# Patient Record
Sex: Male | Born: 1990 | Race: Black or African American | Hispanic: No | Marital: Married | Smoking: Former smoker
Health system: Southern US, Community
[De-identification: ages and names within clinical notes are randomized; demographics above are authoritative.]

## PROBLEM LIST (undated history)

## (undated) ENCOUNTER — Ambulatory Visit: Admission: EM | Payer: Self-pay | Source: Home / Self Care

## (undated) DIAGNOSIS — F17200 Nicotine dependence, unspecified, uncomplicated: Secondary | ICD-10-CM

## (undated) DIAGNOSIS — J45909 Unspecified asthma, uncomplicated: Secondary | ICD-10-CM

## (undated) HISTORY — DX: Nicotine dependence, unspecified, uncomplicated: F17.200

## (undated) HISTORY — PX: OTHER SURGICAL HISTORY: SHX169

---

## 2019-05-17 ENCOUNTER — Ambulatory Visit: Payer: Self-pay | Attending: Internal Medicine

## 2019-05-17 DIAGNOSIS — Z23 Encounter for immunization: Secondary | ICD-10-CM

## 2019-05-17 NOTE — Progress Notes (Signed)
   Covid-19 Vaccination Clinic  Name:  Randy Molina    MRN: [id number] DOB: 1990/12/13  05/17/2019  Mr. Randy Molina was observed post Covid-19 immunization for 15 minutes without incident. He was provided with Vaccine Information Sheet and instruction to access the V-Safe system.   Mr. Randy Molina was instructed to call 911 with any severe reactions post vaccine: Marland Kitchen Difficulty breathing  . Swelling of face and throat  . A fast heartbeat  . A bad rash all over body  . Dizziness and weakness   Immunizations Administered    Name Date Dose VIS Date Route   Pfizer COVID-19 Vaccine 05/17/2019  9:38 AM 0.3 mL 02/26/2018 Intramuscular   Manufacturer: [employer], [employer]   Lot: [id number]   NDC: [address]-4

## 2019-06-09 ENCOUNTER — Ambulatory Visit: Payer: Self-pay | Attending: Internal Medicine

## 2019-06-09 DIAGNOSIS — Z23 Encounter for immunization: Secondary | ICD-10-CM

## 2019-06-09 NOTE — Progress Notes (Signed)
   Covid-19 Vaccination Clinic  Name:  Demichael Traum    MRN: [id number] DOB: [birth year]  06/09/2019  Mr. Reicher was observed post Covid-19 immunization for 15 minutes without incident. He was provided with Vaccine Information Sheet and instruction to access the V-Safe system.   Mr. Mcmanaway was instructed to call 911 with any severe reactions post vaccine: Marland Kitchen Difficulty breathing  . Swelling of face and throat  . A fast heartbeat  . A bad rash all over body  . Dizziness and weakness   Immunizations Administered    Name Date Dose VIS Date Route   Pfizer COVID-19 Vaccine 06/09/2019 11:47 AM 0.3 mL 02/26/2018 Intramuscular   Manufacturer: [employer], [employer]   Lot: [id number]   NDC: [address]-4

## 2021-01-17 ENCOUNTER — Encounter (HOSPITAL_BASED_OUTPATIENT_CLINIC_OR_DEPARTMENT_OTHER): Payer: Self-pay

## 2021-01-17 ENCOUNTER — Emergency Department (HOSPITAL_BASED_OUTPATIENT_CLINIC_OR_DEPARTMENT_OTHER)
Admission: EM | Admit: 2021-01-17 | Discharge: 2021-01-17 | Disposition: A | Discharge status: Home / Self Care | Payer: Medicare Other | Attending: Emergency Medicine | Admitting: Emergency Medicine

## 2021-01-17 ENCOUNTER — Other Ambulatory Visit: Payer: Self-pay

## 2021-01-17 ENCOUNTER — Emergency Department (HOSPITAL_BASED_OUTPATIENT_CLINIC_OR_DEPARTMENT_OTHER): Payer: Medicare Other

## 2021-01-17 DIAGNOSIS — F17210 Nicotine dependence, cigarettes, uncomplicated: Secondary | ICD-10-CM | POA: Diagnosis not present

## 2021-01-17 DIAGNOSIS — R9431 Abnormal electrocardiogram [ECG] [EKG]: Secondary | ICD-10-CM

## 2021-01-17 DIAGNOSIS — Z20822 Contact with and (suspected) exposure to covid-19: Secondary | ICD-10-CM | POA: Insufficient documentation

## 2021-01-17 DIAGNOSIS — R0602 Shortness of breath: Secondary | ICD-10-CM | POA: Diagnosis present

## 2021-01-17 DIAGNOSIS — R059 Cough, unspecified: Secondary | ICD-10-CM | POA: Insufficient documentation

## 2021-01-17 DIAGNOSIS — R051 Acute cough: Secondary | ICD-10-CM

## 2021-01-17 HISTORY — DX: Unspecified asthma, uncomplicated: J45.909

## 2021-01-17 LAB — CBC WITH DIFFERENTIAL/PLATELET
Abs Immature Granulocytes: 0.02 10*3/uL (ref 0.00–0.07)
Basophils Absolute: 0 10*3/uL (ref 0.0–0.1)
Basophils Relative: 1 %
Eosinophils Absolute: 0.2 10*3/uL (ref 0.0–0.5)
Eosinophils Relative: 2 %
HCT: 38.3 % — ABNORMAL LOW (ref 39.0–52.0)
Hemoglobin: 13.1 g/dL (ref 13.0–17.0)
Immature Granulocytes: 0 %
Lymphocytes Relative: 48 %
Lymphs Abs: 3.9 10*3/uL (ref 0.7–4.0)
MCH: 30.8 pg (ref 26.0–34.0)
MCHC: 34.2 g/dL (ref 30.0–36.0)
MCV: 90.1 fL (ref 80.0–100.0)
Monocytes Absolute: 0.7 10*3/uL (ref 0.1–1.0)
Monocytes Relative: 8 %
Neutro Abs: 3.3 10*3/uL (ref 1.7–7.7)
Neutrophils Relative %: 41 %
Platelets: 168 10*3/uL (ref 150–400)
RBC: 4.25 MIL/uL (ref 4.22–5.81)
RDW: 12.9 % (ref 11.5–15.5)
WBC: 8.1 10*3/uL (ref 4.0–10.5)
nRBC: 0 % (ref 0.0–0.2)

## 2021-01-17 LAB — BASIC METABOLIC PANEL
Anion gap: 9 (ref 5–15)
BUN: 20 mg/dL (ref 6–20)
CO2: 24 mmol/L (ref 22–32)
Calcium: 9.2 mg/dL (ref 8.9–10.3)
Chloride: 104 mmol/L (ref 98–111)
Creatinine, Ser: 1.26 mg/dL — ABNORMAL HIGH (ref 0.61–1.24)
GFR, Estimated: >60 mL/min (ref >60)
Glucose, Bld: 84 mg/dL (ref 70–99)
Potassium: 3.6 mmol/L (ref 3.5–5.1)
Sodium: 137 mmol/L (ref 135–145)

## 2021-01-17 LAB — RESP PANEL BY RT-PCR (FLU A&B, COVID) ARPGX2
Influenza A by PCR: NEGATIVE
Influenza B by PCR: NEGATIVE
SARS Coronavirus 2 by RT PCR: NEGATIVE

## 2021-01-17 LAB — TROPONIN I (HIGH SENSITIVITY): Troponin I (High Sensitivity): 3 ng/L (ref <18)

## 2021-01-17 MED ORDER — PROMETHAZINE-DM 6.25-15 MG/5ML PO SYRP: 5.0000 mL | ORAL_SOLUTION | Freq: Four times a day (QID) | ORAL | 0 refills | Status: DC | PRN

## 2021-01-17 MED ORDER — BENZONATATE 100 MG PO CAPS: 100.0000 mg | ORAL_CAPSULE | Freq: Three times a day (TID) | ORAL | 0 refills | Status: DC

## 2021-01-17 NOTE — ED Provider Notes (Signed)
[facility] EMERGENCY DEPARTMENT Provider Note   CSN: [id number] Arrival date & time: 01/17/21  1828     History  Chief Complaint  Patient presents with   Cough    Randy Molina is a 31 y.o. male with a past medical history significant for significant tobacco use of 1 to 2 packs daily who presents with shortness of breath since this morning.  Patient endorses that he has had occasional cough.  Patient denies any runny nose, sore throat, headache, fever.  Patient does endorse occasional rib pain with coughing, but occasionally at rest.  She describes this chest pain is sharp in nature, not associate with exertion.  He denies radiation to the neck or arms.  He denies nausea or vomiting, diaphoresis with the chest pain.  Patient reports that he had an episode of this sharp left-sided chest pain earlier this morning that resolved after a few minutes.  No chest pain since then. Patient denies any nausea, vomiting, diarrhea, heartburn.  Patient denies active chest pain at this time.  Patient does not seek regular care with a PCP.  No family history of ACS, no personal history of diabetes, no personal history of ACS.   Cough Associated symptoms: shortness of breath       Home Medications Prior to Admission medications   Not on File      Allergies    Patient has no known allergies.    Review of Systems   Review of Systems  Respiratory:  Positive for cough and shortness of breath.   All other systems reviewed and are negative.  Physical Exam Updated Vital Signs BP 107/75    Pulse (!) 59    Temp 98 F (36.7 C) (Oral)    Resp 11    Ht 5\' 7"  (1.702 m)    Wt 69.4 kg    SpO2 98%    BMI 23.96 kg/m  Physical Exam Vitals and nursing note reviewed.  Constitutional:      General: He is not in acute distress.    Appearance: Normal appearance.  HENT:     Head: Normocephalic and atraumatic.  Eyes:     General:        Right eye: No discharge.        Left eye: No discharge.   Cardiovascular:     Rate and Rhythm: Normal rate and regular rhythm.     Heart sounds: No murmur heard.   No friction rub. No gallop.  Pulmonary:     Effort: Pulmonary effort is normal.     Breath sounds: Normal breath sounds.     Comments: Clear lung sounds throughout, no wheezing, no stridor, no rhonchi.  Pittore distress, no accessory muscle use. Abdominal:     General: Bowel sounds are normal.     Palpations: Abdomen is soft.  Skin:    General: Skin is warm and dry.     Capillary Refill: Capillary refill takes less than 2 seconds.  Neurological:     Mental Status: He is alert and oriented to person, place, and time.  Psychiatric:        Mood and Affect: Mood normal.        Behavior: Behavior normal.    ED Results / Procedures / Treatments   Labs (all labs ordered are listed, but only abnormal results are displayed) Labs Reviewed  RESP PANEL BY RT-PCR (FLU A&B, COVID) [id number]  CBC WITH DIFFERENTIAL/PLATELET  BASIC METABOLIC PANEL  TROPONIN I (HIGH SENSITIVITY)  EKG EKG Interpretation  Date/Time:  Monday January 17 2021 19:31:10 EST Ventricular Rate:  65 PR Interval:  137 QRS Duration: 90 QT Interval:  425 QTC Calculation: 442 R Axis:   77 Text Interpretation: Sinus rhythm Nonspecific T abnormalities, anterior leads Confirmed by Madalyn Rob [phone]) on 01/17/2021 7:35:25 PM  Radiology DG Chest 2 View  Result Date: 01/17/2021 CLINICAL DATA:  Flu-like symptoms, shortness of breath. EXAM: CHEST - 2 VIEW COMPARISON:  None. FINDINGS: The heart size and mediastinal contours are within normal limits. Both lungs are clear. The visualized skeletal structures are unremarkable. IMPRESSION: No active cardiopulmonary disease. Electronically Signed   By: Abelardo Diesel M.D.   On: 01/17/2021 19:17    Procedures Procedures    Medications Ordered in ED Medications - No data to display  ED Course/ Medical Decision Making/ A&P                           Medical Decision  Making  I discussed this case with my attending physician who cosigned this note including patient's presenting symptoms, physical exam, and planned diagnostics and interventions. Attending physician stated agreement with plan or made changes to plan which were implemented.   Attending physician assessed patient at bedside.  This is an overall well-appearing male with significant tobacco use who presents with feeling of shortness of breath since this morning.  Patient reports he has some left flank versus upper abdominal versus chest pain with coughing.  No chest pain at rest.  No history of ACS.  Patient denies recent travel, recent leg swelling or irritation or redness.  My differential diagnosis includes shortness of breath secondary to excessive tobacco use versus acute bronchitis versus pneumonia versus ACS versus upper respiratory infection.  Based on my history and physical exam I minimal clinical concern for active ACS at this time, however patient does describe some unexplained left-sided chest pain, associated with shortness of breath this morning.  It is no longer present, does not seem exertional, no personal history of ACS.  His heart score is 2.  I personally ordered and reviewed an EKG which is significant for some T wave inversion in V3.  There are no previous EKGs to compare to.  Personally ordered and reviewed chest x-ray which shows no intrathoracic abnormality.  I agree with radiologist interpretation.  At this time his CBC, BMP, troponin, and RVP are pending.  Discussed I do recommend work-up for possible chest pain.  Patient understands and agrees to plan.  7:59 PM Care of Randy Molina transferred to [facility] and Dr. Roslynn Amble at the end of my shift as the patient will require reassessment once labs/imaging have resulted. Patient presentation, ED course, and plan of care discussed with review of all pertinent labs and imaging. Please see his/her note for further details  regarding further ED course and disposition. Plan at time of handoff is pending ACS workup and dispo. This may be altered or completely changed at the discretion of the oncoming team pending results of further workup.  Final Clinical Impression(s) / ED Diagnoses Final diagnoses:  None    Rx / DC Orders ED Discharge Orders     None         Dorien Chihuahua 01/17/21 1959    Lucrezia Starch, MD 01/17/21 2201

## 2021-01-17 NOTE — Discharge Instructions (Signed)
I have given you to cough medications to use as needed for cough.  I recommend following up with your primary care doctor for general health screening. I discussed your case with our cardiologist today who recommends that you follow-up with their office given that your EKG is abnormal.  Please return to the ER for any new or concerning symptoms including chest pain.

## 2021-01-17 NOTE — ED Triage Notes (Signed)
Pt c/o flu like sx started this am-NAD-steady gait °

## 2021-01-17 NOTE — ED Provider Notes (Signed)
Accepted handoff at shift change from [facility]. Please see prior provider note for more detail.   Briefly: Patient is 31 y.o.   Intermittent CP, cough, SOB  "Randy Molina is a 31 y.o. male with a past medical history significant for significant tobacco use of 1 to 2 packs daily who presents with shortness of breath since this morning.  Patient endorses that he has had occasional cough.  Patient denies any runny nose, sore throat, headache, fever.  Patient does endorse occasional rib pain with coughing, but occasionally at rest.  She describes this chest pain is sharp in nature, not associate with exertion.  He denies radiation to the neck or arms.  He denies nausea or vomiting, diaphoresis with the chest pain.  Patient reports that he had an episode of this sharp left-sided chest pain earlier this morning that resolved after a few minutes.  No chest pain since then. Patient denies any nausea, vomiting, diarrhea, heartburn.  Patient denies active chest pain at this time.  Patient does not seek regular care with a PCP.  No family history of ACS, no personal history of diabetes, no personal history of ACS."    Plan: Follow-up on labs disposition    Physical Exam  BP 103/72 (BP Location: Left Arm)    Pulse 73    Temp 98 F (36.7 C) (Oral)    Resp 20    Ht 5\' 7"  (1.702 m)    Wt 69.4 kg    SpO2 100%    BMI 23.96 kg/m   Physical Exam Vitals and nursing note reviewed.  Constitutional:      General: He is not in acute distress. HENT:     Head: Normocephalic and atraumatic.     Nose: Nose normal.  Eyes:     General: No scleral icterus. Cardiovascular:     Rate and Rhythm: Normal rate and regular rhythm.     Pulses: Normal pulses.     Heart sounds: Normal heart sounds.  Pulmonary:     Effort: Pulmonary effort is normal. No respiratory distress.     Breath sounds: No wheezing.  Abdominal:     Palpations: Abdomen is soft.     Tenderness: There is no abdominal tenderness. There is no  guarding or rebound.  Musculoskeletal:     Cervical back: Normal range of motion.     Right lower leg: No edema.     Left lower leg: No edema.  Skin:    General: Skin is warm and dry.     Capillary Refill: Capillary refill takes less than 2 seconds.  Neurological:     Mental Status: He is alert. Mental status is at baseline.  Psychiatric:        Mood and Affect: Mood normal.        Behavior: Behavior normal.    Procedures  Procedures 01/17/2021  Results for orders placed or performed during the hospital encounter of 01/17/21  Resp Panel by RT-PCR (Flu A&B, Covid) Nasopharyngeal Swab   Specimen: Nasopharyngeal Swab; Nasopharyngeal(NP) swabs in vial transport medium  Result Value Ref Range   SARS Coronavirus 2 by RT PCR NEGATIVE NEGATIVE   Influenza A by PCR NEGATIVE NEGATIVE   Influenza B by PCR NEGATIVE NEGATIVE  CBC with Differential  Result Value Ref Range   WBC 8.1 4.0 - 10.5 K/uL   RBC 4.25 4.22 - 5.81 MIL/uL   Hemoglobin 13.1 13.0 - 17.0 g/dL   HCT 38.3 (L) 39.0 - 52.0 %  MCV 90.1 80.0 - 100.0 fL   MCH 30.8 26.0 - 34.0 pg   MCHC 34.2 30.0 - 36.0 g/dL   RDW 12.9 11.5 - 15.5 %   Platelets 168 150 - 400 K/uL   nRBC 0.0 0.0 - 0.2 %   Neutrophils Relative % 41 %   Neutro Abs 3.3 1.7 - 7.7 K/uL   Lymphocytes Relative 48 %   Lymphs Abs 3.9 0.7 - 4.0 K/uL   Monocytes Relative 8 %   Monocytes Absolute 0.7 0.1 - 1.0 K/uL   Eosinophils Relative 2 %   Eosinophils Absolute 0.2 0.0 - 0.5 K/uL   Basophils Relative 1 %   Basophils Absolute 0.0 0.0 - 0.1 K/uL   Immature Granulocytes 0 %   Abs Immature Granulocytes 0.02 0.00 - 0.07 K/uL  Basic metabolic panel  Result Value Ref Range   Sodium 137 135 - 145 mmol/L   Potassium 3.6 3.5 - 5.1 mmol/L   Chloride 104 98 - 111 mmol/L   CO2 24 22 - 32 mmol/L   Glucose, Bld 84 70 - 99 mg/dL   BUN 20 6 - 20 mg/dL   Creatinine, Ser 1.26 (H) 0.61 - 1.24 mg/dL   Calcium 9.2 8.9 - 10.3 mg/dL   GFR, Estimated >60 >60 mL/min   Anion gap  9 5 - 15  Troponin I (High Sensitivity)  Result Value Ref Range   Troponin I (High Sensitivity) 3 <18 ng/L   DG Chest 2 View  Result Date: 01/17/2021 CLINICAL DATA:  Flu-like symptoms, shortness of breath. EXAM: CHEST - 2 VIEW COMPARISON:  None. FINDINGS: The heart size and mediastinal contours are within normal limits. Both lungs are clear. The visualized skeletal structures are unremarkable. IMPRESSION: No active cardiopulmonary disease. Electronically Signed   By: Abelardo Diesel M.D.   On: 01/17/2021 19:17    ED Course / MDM    Medical Decision Making  I reviewed patient's labs.  I interpreted and reviewed all imaging as well.  Patient with troponin within normal limits at 3.  BMP unremarkable creatinine is 1.26 no prior to compare to.  CBC without leukocytosis or anemia.  COVID influenza negative.  Chest x-ray unremarkable no infiltrate or abnormality.  Discussed the case with on-call cardiology who reviewed EKG and recommended cardiology outpatient follow-up.  Discussed this case my attending physician Dr. Roslynn Amble who is agreeable with my plan to discharge home with follow-up with cardiology.  Given the patient has no active chest pain had a brief period of chest pain earlier today it seems to have been an intermittent chest pain over the past uncertain period of time--due to patient having a poor recollection of his symptoms--I do believe that this patient requires immediate hospitalization.  If that he is asymptomatic apart from cough currently.  Return precautions given specifically should he experience any chest pain to return to the ER.  Otherwise follow-up with cardiology and establish care PCP.  Also recommended patient stop smoking as this is primarily cardiovascular risk factors denies any history of hypertension DM2 hyperlipidemia CAD or stroke.  Uncertain of first-degree relative history.  Shine Antal was evaluated in Emergency Department on 01/17/2021 for the symptoms  described in the history of present illness. He was evaluated in the context of the global COVID-19 pandemic, which necessitated consideration that the patient might be at risk for infection with the SARS-CoV-2 virus that causes COVID-19. Institutional protocols and algorithms that pertain to the evaluation of patients at risk for COVID-19 are in  a state of rapid change based on information released by regulatory bodies including the CDC and federal and state organizations. These policies and algorithms were followed during the patient's care in the ED.     Tedd Sias, [place] 01/17/21 2344    Lucrezia Starch, MD 01/20/21 [phone]

## 2021-01-31 ENCOUNTER — Encounter: Payer: Self-pay | Admitting: Cardiology

## 2021-01-31 ENCOUNTER — Other Ambulatory Visit: Payer: Self-pay

## 2021-01-31 ENCOUNTER — Ambulatory Visit (INDEPENDENT_AMBULATORY_CARE_PROVIDER_SITE_OTHER): Payer: Medicare Other | Admitting: Cardiology

## 2021-01-31 VITALS — BP 100/64 | HR 56 | Ht 67.0 in | Wt 153.0 lb

## 2021-01-31 DIAGNOSIS — R9431 Abnormal electrocardiogram [ECG] [EKG]: Secondary | ICD-10-CM | POA: Insufficient documentation

## 2021-01-31 DIAGNOSIS — R0609 Other forms of dyspnea: Secondary | ICD-10-CM | POA: Diagnosis not present

## 2021-01-31 DIAGNOSIS — J45909 Unspecified asthma, uncomplicated: Secondary | ICD-10-CM | POA: Diagnosis not present

## 2021-01-31 DIAGNOSIS — R079 Chest pain, unspecified: Secondary | ICD-10-CM

## 2021-01-31 MED ORDER — ALBUTEROL SULFATE HFA 108 (90 BASE) MCG/ACT IN AERS: 2.0000 | INHALATION_SPRAY | Freq: Four times a day (QID) | RESPIRATORY_TRACT | 6 refills | Status: DC | PRN

## 2021-01-31 NOTE — Progress Notes (Signed)
Primary Care Provider: Pcp, [facility] Cardiologist: Glenetta Hew, MD Electrophysiologist: None  Clinic Note: Chief Complaint  Patient presents with   New Patient (Initial Visit)   Shortness of Breath    ABN EKG.   Headache    All the time.   Chest Pain    ===================================  ASSESSMENT/PLAN   Problem List Items Addressed This Visit     Nonspecific abnormal electrocardiogram (ECG) (EKG)    Anterior T wave versions somewhat concerning.  It is somewhat localized, therefore is nonspecific.  With him having significant dyspnea and some chest pain I do think it warrants evaluation.  Plan: Check 2D echocardiogram and GXT -> GXT should be able to be read despite the T wave changes, however if there are concerns with accuracy, may need to consider Myoview (I would like to see response to exercise since he also says his heart rate gets really fast.)      Relevant Orders   EKG 12-Lead   ECHOCARDIOGRAM COMPLETE   EXERCISE TOLERANCE TEST (ETT)   Cardiac Stress Test: Informed Consent Details: Physician/Practitioner Attestation; Transcribe to consent form and obtain patient signature   DOE (dyspnea on exertion) - Primary    Relatively young to be having coronary disease, no family history because he does not know his family history.  He is a long-term smoker and therefore is probably the most likely cause of his dyspnea.  However with an abnormal EKG we do need to exclude ischemic and structural cardiac etiology.   Plan: Check 2D echocardiogram and GXT  Shared Decision Making/Informed Consent The risks [chest pain, shortness of breath, cardiac arrhythmias, dizziness, blood pressure fluctuations, myocardial infarction, stroke/transient ischemic attack, and life-threatening complications (estimated to be 1 in 10,000)], benefits (risk stratification, diagnosing coronary artery disease, treatment guidance) and alternatives of an exercise tolerance test were  discussed in detail with [name] and he agrees to proceed.      Relevant Orders   EKG 12-Lead   ECHOCARDIOGRAM COMPLETE   EXERCISE TOLERANCE TEST (ETT)   Cardiac Stress Test: Informed Consent Details: Physician/Practitioner Attestation; Transcribe to consent form and obtain patient signature   Chest pain of uncertain etiology    Somewhat atypical sounding chest pain think can be with or without exertion.  He is having some at rest now.  Does not seem to be cardiac in nature, however his EKG could be construed as showing signs of anterior ischemia.  Plan: Check GXT.      Relevant Orders   EKG 12-Lead   ECHOCARDIOGRAM COMPLETE   EXERCISE TOLERANCE TEST (ETT)   Cardiac Stress Test: Informed Consent Details: Physician/Practitioner Attestation; Transcribe to consent form and obtain patient signature   Asthma    Clearly seems to be having some episodes of asthma, I think we episode leading to his ER evaluation may very well be an asthma attack.  He is looking to establish a primary care provider, in the interim, I will prescribe as needed albuterol.      Relevant Medications   albuterol (VENTOLIN HFA) 108 (90 Base) MCG/ACT inhaler    ===================================  HPI:    Randy Molina is a 31 y.o. male current smoker (1-2 pack/day) who is being seen today for the evaluation of ACUTE COUGH/ABNORMAL EKG at the request of Lucrezia Starch, MD-EDP.  Randy Molina was referred in response to his visit to [facility] on January 16  Recent Hospitalizations:  January 17, 2021: Present with occasional cough and shortness of  breath that began earlier this morning.  No runny nose, sore throat or headache/fever.  Occasional rib pain with coughing-described as a sharp discomfort.  Not exertional.  No radiation. Ruled out for MI with negative troponin x3. EKG.  Showed inferior (lead III) and anterior lead (V3 and V4) T wave inversions => recommend cardiology  follow-up  Reviewed  CV studies:    The following studies were reviewed today: (if available, images/films reviewed: From Epic Chart or Care Everywhere) None:  Interval History:   Randy Molina presents today with multiple different complaints.  Most notably he has had significant exacerbations of the his baseline shortness of breath both at rest and with exertion but worse with exertion.  His presentation to the ER was due to a significant episode of shortness of breath while moving a mattress out of their apartment.  It took several hours for his dyspnea to resolve leading to his hospital evaluation.  He also has a chronic cough that was exacerbated. He denies any PND,orthopnea, or edema.     He says he occasionally feels his heart rate going fast which can occur with or without shortness of breath or discomfort in his chest, but is usually associated with feeling lightheaded and dizzy.  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  None since then but does have episodes of feeling as though he may pass out.   He describes having chest discomfort off and on that can last anywhere from 2 to 3 minutes.  Currently hurting the day.  He describes it over the right side across to the left side.  It can happen with or without exertion and does not necessarily get worse with exertion.  CV Review of Symptoms (Summary) Cardiovascular ROS: positive for - chest pain, dyspnea on exertion, palpitations, shortness of breath, and lightheadedness and dizziness with near syncope negative for - edema, irregular heartbeat, loss of consciousness, orthopnea, paroxysmal nocturnal dyspnea, or TIA/amaurosis fugax, claudication  REVIEWED OF SYSTEMS   Review of Systems  Constitutional:  Negative for malaise/fatigue.  HENT:  Positive for sore throat. Negative for congestion.   Respiratory:  Positive for cough (Chronic) and wheezing (None recently but does have intermittent  episodes). Negative for sputum production.   Cardiovascular:  Negative for leg swelling.  Gastrointestinal:  Negative for blood in stool and melena.  Genitourinary:  Negative for hematuria.  Musculoskeletal:  Positive for joint pain.       Bilateral leg pain from knee to calves and shins   Neurological:  Positive for dizziness. Negative for focal weakness.       Occasional near syncope  Psychiatric/Behavioral:  Negative for depression and memory loss. The patient is nervous/anxious. The patient does not have insomnia.    I have reviewed and (if needed) personally updated the patient's problem list, medications, allergies, past medical and surgical history, social and family history.   PAST MEDICAL HISTORY   Past Medical History:  Diagnosis Date   Asthma    Not currently on medications; pending establishment of primary care   Current smoker    1.5 to 2 pack/day    PAST SURGICAL HISTORY   Past Surgical History:  Procedure Laterality Date   None      Immunization History  Administered Date(s) Administered   PFIZER(Purple Top)SARS-COV-2 Vaccination 05/17/2019, 06/09/2019    MEDICATIONS/ALLERGIES   Current Meds  Medication Sig   albuterol (VENTOLIN HFA) 108 (90 Base) MCG/ACT inhaler Inhale 2 puffs  into the lungs every 6 (six) hours as needed for wheezing or shortness of breath.    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   They have recently moved from [place] roughly 2 years ago to be closer to his family-on his father side.  Both of his parents have deceased.  Mother died  at age 5 when he was age 74.  She apparently had lung cancer.  Father died recently at age 45.      He had not met his family on the father side until his father's death.  He subsequently decided to move up to [place] to be closer to family.      He works seasonally with a [employer].  He does ground and equipment work.  Currently off work because of the wintertime.      He smokes up to 2 packs a day depending on level of the anxiety.   Family History  Problem Relation Age of Onset   Lung cancer Mother 63   Cancer Father 22       Started off and jaw, recurred after resection    OBJCTIVE -PE, EKG, labs   Wt Readings from Last 3 Encounters:  01/31/21 153 lb (69.4 kg)  01/17/21 153 lb (69.4 kg)    Physical Exam: BP 100/64 (BP Location: Right Arm, Patient Position: Sitting, Cuff Size: Normal)    Pulse (!) 56    Ht 5' 7"  (1.702 m)    Wt 153 lb (69.4 kg)    BMI 23.96 kg/m  Physical Exam Vitals reviewed.  Constitutional:      General: He is not in acute distress.    Appearance: He is well-developed.  HENT:     Head: Normocephalic.  Neck:     Vascular: No JVD.  Cardiovascular:     Rate and Rhythm: Regular rhythm. Bradycardia present.     Pulses: Normal pulses. No decreased pulses.     Heart sounds: Normal heart sounds. No murmur heard.   No friction rub. No gallop.  Pulmonary:     Effort: Pulmonary effort is normal. No respiratory distress.     Breath sounds: Normal breath sounds.  Chest:     Chest wall: Tenderness (Bilateral costosternal borders) present.  Abdominal:     General: Abdomen is flat. Bowel sounds are normal. There is no distension.     Palpations: Abdomen is soft.  Musculoskeletal:        General: No swelling. Normal range of motion.     Cervical back: Normal range of motion and neck supple.  Skin:    General: Skin is warm and dry.  Neurological:     General: No focal deficit present.     Mental Status: He is alert and  oriented to person, place, and time.     Gait: Gait normal.  Psychiatric:        Mood and Affect: Mood normal.        Behavior: Behavior normal.  Thought Content: Thought content normal.        Judgment: Judgment normal.     Adult ECG Report  Rate: 56 ;  Rhythm: sinus bradycardia and T wave inversion in V3 with T waves in lead III.  Otherwise normal axis, intervals and durations. ;   Narrative Interpretation: Relatively stable EKG but TID reasons only noted in V3  Recent Labs: Reviewed No results found for: CHOL, HDL, LDLCALC, LDLDIRECT, TRIG, CHOLHDL Lab Results  Component Value Date   CREATININE 1.26 (H) 01/17/2021   BUN 20 01/17/2021   NA 137 01/17/2021   K 3.6 01/17/2021   CL 104 01/17/2021   CO2 24 01/17/2021   CBC Latest Ref Rng & Units 01/17/2021  WBC 4.0 - 10.5 K/uL 8.1  Hemoglobin 13.0 - 17.0 g/dL 13.1  Hematocrit 39.0 - 52.0 % 38.3(L)  Platelets 150 - 400 K/uL 168    No results found for: HGBA1C No results found for: TSH  ==================================================  COVID-19 Education: The signs and symptoms of COVID-19 were discussed with the patient and how to seek care for testing (follow up with PCP or arrange E-visit).    I spent a total of 29 minutes with the patient spent in direct patient consultation.  Additional time spent with chart review  / charting (studies, outside notes, etc): 19 min Total Time: 48 min  Current medicines are reviewed at length with the patient today.  (+/- concerns) n/a  This visit occurred during the SARS-CoV-2 public health emergency.  Safety protocols were in place, including screening questions prior to the visit, additional usage of staff PPE, and extensive cleaning of exam room while observing appropriate contact time as indicated for disinfecting solutions.  Notice: This dictation was prepared with Dragon dictation along with smart phrase technology. Any transcriptional errors that result from this process  are unintentional and may not be corrected upon review.   Studies Ordered:  Orders Placed This Encounter  Procedures   Cardiac Stress Test: Informed Consent Details: Physician/Practitioner Attestation; Transcribe to consent form and obtain patient signature   EXERCISE TOLERANCE TEST (ETT)   EKG 12-Lead   ECHOCARDIOGRAM COMPLETE    Patient Instructions / Medication Changes & Studies & Tests Ordered   Patient Instructions  Medication Instructions:    May use  Albuterol inhaler as needed for  shortness of breath - asthma   *If you need a refill on your cardiac medications before your next appointment, please call your pharmacy*   Lab Work:  Not needed   Testing/Procedures:  Will be schedule at [facility] has requested that you have an echocardiogram. Echocardiography is a painless test that uses sound waves to create images of your heart. It provides your doctor with information about the size and shape of your heart and how well your hearts chambers and valves are working. This procedure takes approximately one hour. There are no restrictions for this procedure.  And will be schedule at [facility] has requested that you have an exercise tolerance test. Please also follow instruction sheet, as given.    Follow-Up: At [facility], you and your health needs are our priority.  As part of our continuing mission to provide you with exceptional heart care, we have created designated Provider Care Teams.  These Care Teams include your primary Cardiologist (physician) and Advanced Practice Providers (APPs -  Physician Assistants and Nurse Practitioners) who all work together to provide you with the  care you need, when you need it.     Your next appointment:   2 month(s)  The format for your next appointment:   In Person  Provider:   None      Glenetta Hew, M.D., M.S. Interventional Cardiologist    Pager # [phone] Phone # [phone] [address] [place], [place] [address]   Thank you for choosing Heartcare at [facility]!!

## 2021-01-31 NOTE — Assessment & Plan Note (Signed)
Clearly seems to be having some episodes of asthma, I think we episode leading to his ER evaluation may very well be an asthma attack.  He is looking to establish a primary care provider, in the interim, I will prescribe as needed albuterol.

## 2021-01-31 NOTE — Assessment & Plan Note (Signed)
Anterior T wave versions somewhat concerning.  It is somewhat localized, therefore is nonspecific.  With him having significant dyspnea and some chest pain I do think it warrants evaluation.  Plan: Check 2D echocardiogram and GXT -> GXT should be able to be read despite the T wave changes, however if there are concerns with accuracy, may need to consider Myoview (I would like to see response to exercise since he also says his heart rate gets really fast.)

## 2021-01-31 NOTE — Patient Instructions (Addendum)
Medication Instructions:    May use  Albuterol inhaler as needed for  shortness of breath - asthma   *If you need a refill on your cardiac medications before your next appointment, please call your pharmacy*   Lab Work:  Not needed   Testing/Procedures:  Will be schedule at [address] Your physician has requested that you have an echocardiogram. Echocardiography is a painless test that uses sound waves to create images of your heart. It provides your doctor with information about the size and shape of your heart and how well your hearts chambers and valves are working. This procedure takes approximately one hour. There are no restrictions for this procedure.  And will be schedule at [address] [facility] [address] Your physician has requested that you have an exercise tolerance test. Please also follow instruction sheet, as given.    Follow-Up: At [facility] you and your health needs are our priority.  As part of our continuing mission to provide you with exceptional heart care, we have created designated Provider Care Teams.  These Care Teams include your primary Cardiologist (physician) and Advanced Practice Providers (APPs -  Physician Assistants and Nurse Practitioners) who all work together to provide you with the care you need, when you need it.     Your next appointment:   2 month(s)  The format for your next appointment:   In Person  Provider:   None

## 2021-01-31 NOTE — Assessment & Plan Note (Signed)
Relatively young to be having coronary disease, no family history because he does not know his family history.  He is a long-term smoker and therefore is probably the most likely cause of his dyspnea.  However with an abnormal EKG we do need to exclude ischemic and structural cardiac etiology.   Plan: Check 2D echocardiogram and GXT  Shared Decision Making/Informed Consent The risks [chest pain, shortness of breath, cardiac arrhythmias, dizziness, blood pressure fluctuations, myocardial infarction, stroke/transient ischemic attack, and life-threatening complications (estimated to be 1 in 10,000)], benefits (risk stratification, diagnosing coronary artery disease, treatment guidance) and alternatives of an exercise tolerance test were discussed in detail with Randy Molina and he agrees to proceed.

## 2021-01-31 NOTE — Assessment & Plan Note (Signed)
Somewhat atypical sounding chest pain think can be with or without exertion.  He is having some at rest now.  Does not seem to be cardiac in nature, however his EKG could be construed as showing signs of anterior ischemia.  Plan: Check GXT.

## 2021-02-04 ENCOUNTER — Telehealth (HOSPITAL_COMMUNITY): Payer: Self-pay | Admitting: *Deleted

## 2021-02-04 NOTE — Telephone Encounter (Signed)
Close encounter 

## 2021-02-08 ENCOUNTER — Other Ambulatory Visit: Payer: Self-pay

## 2021-02-08 ENCOUNTER — Ambulatory Visit (HOSPITAL_COMMUNITY)
Admission: RE | Admit: 2021-02-08 | Discharge: 2021-02-08 | Disposition: A | Discharge status: Home / Self Care | Payer: Medicare Other | Source: Ambulatory Visit | Attending: Cardiovascular Disease | Admitting: Cardiovascular Disease

## 2021-02-08 ENCOUNTER — Ambulatory Visit (HOSPITAL_BASED_OUTPATIENT_CLINIC_OR_DEPARTMENT_OTHER): Payer: Medicare Other

## 2021-02-08 DIAGNOSIS — R0609 Other forms of dyspnea: Secondary | ICD-10-CM | POA: Diagnosis not present

## 2021-02-08 DIAGNOSIS — R9431 Abnormal electrocardiogram [ECG] [EKG]: Secondary | ICD-10-CM | POA: Insufficient documentation

## 2021-02-08 DIAGNOSIS — R079 Chest pain, unspecified: Secondary | ICD-10-CM | POA: Diagnosis not present

## 2021-02-08 HISTORY — PX: OTHER SURGICAL HISTORY: SHX169

## 2021-02-08 HISTORY — PX: TRANSTHORACIC ECHOCARDIOGRAM: SHX275

## 2021-02-08 LAB — EXERCISE TOLERANCE TEST
Angina Index: 0
Duke Treadmill Score: 10
Estimated workload: 12.5
Exercise duration (min): 10 min
Exercise duration (sec): 29 s
MPHR: 189 {beats}/min
Peak HR: 146 {beats}/min
Percent HR: 77 %
Rest HR: 63 {beats}/min
ST Depression (mm): 0 mm

## 2021-02-08 LAB — ECHOCARDIOGRAM COMPLETE
Area-P 1/2: 4.23 cm2
S' Lateral: 2.8 cm

## 2021-02-12 ENCOUNTER — Encounter: Payer: Self-pay | Admitting: Cardiology

## 2021-02-12 DIAGNOSIS — I428 Other cardiomyopathies: Secondary | ICD-10-CM | POA: Insufficient documentation

## 2021-02-18 ENCOUNTER — Telehealth: Payer: Self-pay | Admitting: *Deleted

## 2021-02-18 DIAGNOSIS — Z01818 Encounter for other preprocedural examination: Secondary | ICD-10-CM

## 2021-02-18 DIAGNOSIS — R9431 Abnormal electrocardiogram [ECG] [EKG]: Secondary | ICD-10-CM

## 2021-02-18 DIAGNOSIS — R931 Abnormal findings on diagnostic imaging of heart and coronary circulation: Secondary | ICD-10-CM

## 2021-02-18 DIAGNOSIS — R0609 Other forms of dyspnea: Secondary | ICD-10-CM

## 2021-02-18 NOTE — Telephone Encounter (Signed)
-----   Message from Marykay Lex, MD sent at 02/10/2021  9:10 PM EST ----- Echocardiogram results: Left ventricle pulm function is normal 60 to 65%.  Right ventricle is normal.  Normal valves. There is concern for possible thickening and abnormal finding in the apical (tip portion of the heart.  Recommended that we evaluate with either a cardiac MRI or echocardiogram with enhancing contrast to better evaluate.  We will discuss with imaging cardiologist to determine best option.   Bryan Lemma, MD.  Meredith Staggers or Thayer Ohm -- can you guys take a look @ this Echo - ?? Should we order Echo with Definity vs. MRI?

## 2021-02-18 NOTE — Telephone Encounter (Signed)
The patient has been notified of the result and verbalized understanding.  All questions (if any) were answered. Patient aware  Cardiac MRI has been ordered along with CBC.  Tobin Chad, RN 02/18/2021 3:01 PM

## 2021-02-18 NOTE — Telephone Encounter (Signed)
02/12/2021  5:58 PM EST Back to Top    Echocardiogram reviewed by imaging specialist, he does not feel that this is a dramatic abnormality, but does agree that an MRI may be reasonable just to exclude a condition called noncompaction. Would like to order cardiac MRI.   Bryan Lemma, MD

## 2021-02-22 LAB — CBC
Hematocrit: 41.4 % (ref 37.5–51.0)
Hemoglobin: 13.8 g/dL (ref 13.0–17.7)
MCH: 30.5 pg (ref 26.6–33.0)
MCHC: 33.3 g/dL (ref 31.5–35.7)
MCV: 91 fL (ref 79–97)
Platelets: 176 10*3/uL (ref 150–450)
RBC: 4.53 x10E6/uL (ref 4.14–5.80)
RDW: 12.5 % (ref 11.6–15.4)
WBC: 10.9 10*3/uL — ABNORMAL HIGH (ref 3.4–10.8)

## 2021-02-24 ENCOUNTER — Telehealth: Payer: Self-pay | Admitting: *Deleted

## 2021-02-24 NOTE — Telephone Encounter (Signed)
-----   Message from Leonie Man, MD sent at 02/22/2021 10:49 PM EST ----- CBC is relatively normal, but white count is up a little bit.  In the absence of fevers or other illness type symptoms, not sure what to make of this.  Glenetta Hew, MD

## 2021-02-24 NOTE — Telephone Encounter (Signed)
The patient has been notified of the result and verbalized understanding.  All questions (if any) were answered. Patient states he has  thinks  he has "cold"  recommend  contact ing PCP. Patient states he does not have PCP - recommend  an urgent care or [facility]. Can use something over the counter if symptoms does not get better in 7 days go an be evaluated. Patient voice understanding Randy Chad RN 02/24/2021 3:35 PM

## 2021-03-02 ENCOUNTER — Emergency Department (HOSPITAL_BASED_OUTPATIENT_CLINIC_OR_DEPARTMENT_OTHER)
Admission: EM | Admit: 2021-03-02 | Discharge: 2021-03-02 | Disposition: A | Discharge status: Home / Self Care | Payer: Medicare Other | Attending: Emergency Medicine | Admitting: Emergency Medicine

## 2021-03-02 ENCOUNTER — Other Ambulatory Visit: Payer: Self-pay

## 2021-03-02 ENCOUNTER — Encounter (HOSPITAL_BASED_OUTPATIENT_CLINIC_OR_DEPARTMENT_OTHER): Payer: Self-pay

## 2021-03-02 DIAGNOSIS — S0033XA Contusion of nose, initial encounter: Secondary | ICD-10-CM | POA: Insufficient documentation

## 2021-03-02 DIAGNOSIS — S0992XA Unspecified injury of nose, initial encounter: Secondary | ICD-10-CM | POA: Diagnosis present

## 2021-03-02 DIAGNOSIS — Y990 Civilian activity done for income or pay: Secondary | ICD-10-CM | POA: Insufficient documentation

## 2021-03-02 DIAGNOSIS — J040 Acute laryngitis: Secondary | ICD-10-CM

## 2021-03-02 DIAGNOSIS — W228XXA Striking against or struck by other objects, initial encounter: Secondary | ICD-10-CM | POA: Insufficient documentation

## 2021-03-02 DIAGNOSIS — R051 Acute cough: Secondary | ICD-10-CM

## 2021-03-02 MED ORDER — LORATADINE 10 MG PO TABS: 10.0000 mg | ORAL_TABLET | Freq: Every day | ORAL | 0 refills | Status: DC

## 2021-03-02 MED ORDER — FAMOTIDINE 20 MG PO TABS: 20.0000 mg | ORAL_TABLET | Freq: Two times a day (BID) | ORAL | 0 refills | Status: DC

## 2021-03-02 MED ORDER — BENZONATATE 100 MG PO CAPS: 100.0000 mg | ORAL_CAPSULE | Freq: Three times a day (TID) | ORAL | 0 refills | Status: DC

## 2021-03-02 MED ORDER — ALBUTEROL SULFATE HFA 108 (90 BASE) MCG/ACT IN AERS: 2.0000 | INHALATION_SPRAY | RESPIRATORY_TRACT | 0 refills | Status: DC | PRN

## 2021-03-02 NOTE — Discharge Instructions (Addendum)
1.  Take Pepcid twice daily for 2 weeks as prescribed.  This is to help with possible reflux which is a common cause of chronic intermittent cough and laryngitis, as well as asthma exacerbation. ?2.  Use your albuterol inhaler 2-3 times daily for the next 3 days.  Also use if needed for wheezing or coughing episode.  Then resume as needed use. ?3.  Take Tessalon Perles for cough.  Do not chew or suck on them.  Swallow them whole as instructed. ?4.  You may take extra strength Tylenol for discomfort around your nose.  Also you may apply well wrapped ice packs.  Swelling and pain may last for up to 10-14 days after an injury.  Even if your nose is broken, it may not need any specific treatment.  Broken noses are treated if they cause cosmetic problems or persistent difficulty breathing.  You should see an ear nose throat specialist if pain or breathing difficulty persists after 2 weeks.  Discuss this with your family doctor when you are seen later this month. ?5.  Return to the emergency department if you are developing increasing or worsening chest pain, increasing or worsening shortness of breath, fevers or other concerning symptoms. ?6.  Continue to work on quitting smoking altogether.  This is often a cause for chronic cough as well, in addition to many other serious health problems. ?
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
## 2021-03-02 NOTE — ED Provider Notes (Signed)
?[facility] EMERGENCY DEPARTMENT ?Provider Note ? ? ?CSN: [id number] ?Arrival date & time: 03/02/21  4132 ? ?  ? ?History ? ?Chief Complaint  ?Patient presents with  ? abnormal labs  ? ? ?Randy Molina is a 31 y.o. male. ? ?HPI ?Patient reports he has had about 2 weeks of intermittent cough.  It comes and goes.  Cough is sometimes productive of yellow sputum.  No associated fever.  Patient reports he does have associated hoarse voice that is coming on several times he also has nasal congestion.  In addition to the nasal congestion however he has nasal tenderness as he was struck on the nose at work several days ago.  Reports nose feels swollen and tender on the right side.  It was some bleeding initially when it happened but none since.  Patient is not experiencing any vomiting or diarrhea.  No lower extremity swelling.  Cough is sometimes worse at night.  Patient has decreased his smoking.  He was at 1 point smoking 2 packs a day.  He reports he is down to about a half a pack now.  Ports he has used his albuterol inhaler a couple times since having the cough.  Not using it frequently. ? ? ?  ? ?Home Medications ?Prior to Admission medications   ?Medication Sig Start Date End Date Taking? Authorizing Provider  ?albuterol (VENTOLIN HFA) 108 (90 Base) MCG/ACT inhaler Inhale 2 puffs into the lungs every 4 (four) hours as needed for wheezing or shortness of breath. 03/02/21  Yes Arby Barrette, MD  ?benzonatate (TESSALON) 100 MG capsule Take 1 capsule (100 mg total) by mouth every 8 (eight) hours. 03/02/21  Yes Arby Barrette, MD  ?famotidine (PEPCID) 20 MG tablet Take 1 tablet (20 mg total) by mouth 2 (two) times daily. 03/02/21  Yes Arby Barrette, MD  ?loratadine (CLARITIN) 10 MG tablet Take 1 tablet (10 mg total) by mouth daily. 03/02/21  Yes Arby Barrette, MD  ?albuterol (VENTOLIN HFA) 108 (90 Base) MCG/ACT inhaler Inhale 2 puffs into the lungs every 6 (six) hours as needed for wheezing or shortness of  breath. 01/31/21   Marykay Lex, MD  ?benzonatate (TESSALON) 100 MG capsule Take 1 capsule (100 mg total) by mouth every 8 (eight) hours. 01/17/21   Gailen Shelter, PA  ?promethazine-dextromethorphan (PROMETHAZINE-DM) 6.25-15 MG/5ML syrup Take 5 mLs by mouth 4 (four) times daily as needed for cough. 01/17/21   Gailen Shelter, PA  ?   ? ?Allergies    ?Patient has no known allergies.   ? ?Review of Systems   ?Review of Systems ?10 systems reviewed negative except as per HPI ?Physical Exam ?Updated Vital Signs ?BP 116/82 (BP Location: Right Arm)   Pulse 77   Temp 98.2 ?F (36.8 ?C) (Oral)   Resp 18   Ht 5\' 7"  (1.702 m)   Wt 68 kg   SpO2 100%   BMI 23.49 kg/m?  ?Physical Exam ?Constitutional:   ?   Comments: Well-nourished well-developed.  Alert nontoxic.  Clinically well in appearance.  No respiratory distress  ?HENT:  ?   Head:  ?   Comments: Patient has mild swelling to the lateral aspect of the bridge of the nose on the right.  Not easily perceivable.  Nasal bridge appears straight.  Patient is focally tender on the nasal bridge and the right lateral aspect of the nose. ?   Nose:  ?   Comments: No blood in the naris.  Slight bogginess of  the turbinates on the right.  No septal hematoma.  Bilateral TMs normal. ?   Mouth/Throat:  ?   Comments: Posterior oropharynx widely patent.  No tonsillar exudate or swelling.  Uvula midline.  There is some brownish staining on the tongue from smoking. ?Eyes:  ?   Extraocular Movements: Extraocular movements intact.  ?   Conjunctiva/sclera: Conjunctivae normal.  ?   Pupils: Pupils are equal, round, and reactive to light.  ?Cardiovascular:  ?   Rate and Rhythm: Normal rate and regular rhythm.  ?Pulmonary:  ?   Effort: Pulmonary effort is normal.  ?   Breath sounds: Normal breath sounds.  ?Abdominal:  ?   General: There is no distension.  ?   Palpations: Abdomen is soft.  ?   Tenderness: There is no abdominal tenderness. There is no guarding.  ?Musculoskeletal:     ?    General: No swelling or tenderness. Normal range of motion.  ?   Cervical back: Neck supple.  ?   Right lower leg: No edema.  ?   Left lower leg: No edema.  ?Skin: ?   General: Skin is warm and dry.  ?Neurological:  ?   General: No focal deficit present.  ?   Mental Status: He is oriented to person, place, and time.  ?   Motor: No weakness.  ?   Coordination: Coordination normal.  ?Psychiatric:     ?   Mood and Affect: Mood normal.  ? ? ?ED Results / Procedures / Treatments   ?Labs ?(all labs ordered are listed, but only abnormal results are displayed) ?Labs Reviewed - No data to display ? ?EKG ?None ? ?Radiology ?No results found. ? ?Procedures ?Procedures  ? ? ?Medications Ordered in ED ?Medications - No data to display ? ?ED Course/ Medical Decision Making/ A&P ?  ?                        ?Medical Decision Making ? ?Patient presents with concern for a call from cardiology office regarding "abnormal white blood cell count".  Patient had been told it might be in association with an infection that needed treatment.  He was recommended to see his PCP or an urgent care provider.  Patient reports that he has had cough and congestion that comes and goes over 2 weeks duration.  He has sick contacts with small children at home who are also having similar symptoms.  Clinically the patient is well.  The lab drawn as outpatient was a CBC of 10.7.  Nonspecific in the setting of intermittent cough for 2 weeks. ? ?Patient has already had echocardiogram and stress test with good results except mild questionable apex thickening with recommendation for follow-up MRI that is scheduled.  Echo has ruled out any significant valvular or cardiac myopathy findings. ? ?Patient also has a history of asthma.  He is not currently wheezing.  Symptoms are coming and going.  He continues to smoke although does report cutting back significantly at this time I most suspect either viral bronchitis, intermittent asthma exacerbations with reflux as  possible source is patient describes waxing and waning hoarse voice.  Physical exam and history are not suggestive of bacterial pneumonia at this time.  Plan will be to continue with the symptomatic approach for the next 2 weeks until follow-up with PCP.  Recommendation is for 2 weeks of Pepcid for possible reflux precipitating intermittent cough, laryngitis and asthma exacerbation.  Recommendation is for about  3 to 5 days of regular albuterol use for possible viral bronchitis.  Patient describes congestion in the nasal passages, he also recently had a nasal contusion.  Will give a trial of Claritin.  Return precautions are included in discharge instructions.  At this time I did not feel additional diagnostic studies needed. ? ? ? ? ? ? ? ?Final Clinical Impression(s) / ED Diagnoses ?Final diagnoses:  ?Acute cough  ?Nasal contusion  ?Laryngitis  ? ? ?Rx / DC Orders ?ED Discharge Orders   ? ?      Ordered  ?  famotidine (PEPCID) 20 MG tablet  2 times daily       ? 03/02/21 0826  ?  benzonatate (TESSALON) 100 MG capsule  Every 8 hours       ? 03/02/21 0826  ?  albuterol (VENTOLIN HFA) 108 (90 Base) MCG/ACT inhaler  Every 4 hours PRN       ? 03/02/21 0826  ?  loratadine (CLARITIN) 10 MG tablet  Daily       ? 03/02/21 0826  ? ?  ?  ? ?  ? ? ?  ?Arby Barrette, MD ?03/02/21 [phone] ? ?

## 2021-03-07 ENCOUNTER — Telehealth (HOSPITAL_COMMUNITY): Payer: Self-pay | Admitting: *Deleted

## 2021-03-07 NOTE — Telephone Encounter (Signed)
Reaching out to patient to offer assistance regarding upcoming cardiac imaging study; pt verbalizes understanding of appt date/time, parking situation and where to check in, and verified current allergies; name and call back number provided for further questions should they arise ? ?Gordy Clement RN Navigator Cardiac Imaging ?[facility] ?[phone] office ?[phone] cell ? ?Denies claustrophobia or metal. ?

## 2021-03-08 ENCOUNTER — Ambulatory Visit (HOSPITAL_COMMUNITY): Admission: RE | Admit: 2021-03-08 | Payer: Medicare Other | Source: Ambulatory Visit

## 2021-03-10 ENCOUNTER — Ambulatory Visit: Payer: Medicare Other | Admitting: Nurse Practitioner

## 2021-03-18 ENCOUNTER — Ambulatory Visit: Payer: Medicare Other | Admitting: Cardiology

## 2021-03-24 ENCOUNTER — Encounter: Payer: Self-pay | Admitting: Cardiology

## 2021-03-29 ENCOUNTER — Ambulatory Visit: Payer: Medicare Other | Admitting: Nurse Practitioner

## 2021-04-08 ENCOUNTER — Telehealth (HOSPITAL_COMMUNITY): Payer: Self-pay | Admitting: *Deleted

## 2021-04-08 NOTE — Telephone Encounter (Signed)
Attempted to call patient regarding upcoming cardiac MRI appointment. Unable to leave a message.  Chaise Passarella RN Navigator Cardiac Imaging [facility] [phone] Office [phone] Cell  

## 2021-04-11 ENCOUNTER — Ambulatory Visit (HOSPITAL_COMMUNITY)
Admission: RE | Admit: 2021-04-11 | Discharge: 2021-04-11 | Disposition: A | Discharge status: Home / Self Care | Payer: Medicare Other | Source: Ambulatory Visit | Attending: Cardiology | Admitting: Cardiology

## 2021-04-11 DIAGNOSIS — R0609 Other forms of dyspnea: Secondary | ICD-10-CM | POA: Diagnosis present

## 2021-04-11 DIAGNOSIS — R931 Abnormal findings on diagnostic imaging of heart and coronary circulation: Secondary | ICD-10-CM | POA: Diagnosis present

## 2021-04-11 DIAGNOSIS — R9431 Abnormal electrocardiogram [ECG] [EKG]: Secondary | ICD-10-CM

## 2021-04-11 HISTORY — PX: OTHER SURGICAL HISTORY: SHX169

## 2021-04-11 MED ORDER — GADOBUTROL 1 MMOL/ML IV SOLN
8.0000 mL | Freq: Once | INTRAVENOUS | Status: AC | PRN
  Administered 2021-04-11: 8 mL via INTRAVENOUS

## 2021-07-09 ENCOUNTER — Ambulatory Visit
Admission: EM | Admit: 2021-07-09 | Discharge: 2021-07-09 | Disposition: A | Discharge status: Home / Self Care | Payer: Medicare Other | Attending: Urgent Care | Admitting: Urgent Care

## 2021-07-09 ENCOUNTER — Ambulatory Visit: Payer: Medicare Other

## 2021-07-09 ENCOUNTER — Encounter: Payer: Self-pay | Admitting: Emergency Medicine

## 2021-07-09 ENCOUNTER — Ambulatory Visit (INDEPENDENT_AMBULATORY_CARE_PROVIDER_SITE_OTHER): Payer: Medicare Other

## 2021-07-09 DIAGNOSIS — R0981 Nasal congestion: Secondary | ICD-10-CM | POA: Diagnosis not present

## 2021-07-09 DIAGNOSIS — J018 Other acute sinusitis: Secondary | ICD-10-CM | POA: Diagnosis not present

## 2021-07-09 DIAGNOSIS — F17200 Nicotine dependence, unspecified, uncomplicated: Secondary | ICD-10-CM

## 2021-07-09 DIAGNOSIS — R0989 Other specified symptoms and signs involving the circulatory and respiratory systems: Secondary | ICD-10-CM

## 2021-07-09 DIAGNOSIS — R059 Cough, unspecified: Secondary | ICD-10-CM

## 2021-07-09 DIAGNOSIS — J4530 Mild persistent asthma, uncomplicated: Secondary | ICD-10-CM

## 2021-07-09 DIAGNOSIS — R052 Subacute cough: Secondary | ICD-10-CM | POA: Diagnosis not present

## 2021-07-09 MED ORDER — PROMETHAZINE-DM 6.25-15 MG/5ML PO SYRP: 2.5000 mL | ORAL_SOLUTION | Freq: Three times a day (TID) | ORAL | 0 refills | Status: DC | PRN

## 2021-07-09 MED ORDER — PREDNISONE 50 MG PO TABS: 50.0000 mg | ORAL_TABLET | Freq: Every day | ORAL | 0 refills | Status: DC

## 2021-07-09 MED ORDER — AMOXICILLIN-POT CLAVULANATE 875-125 MG PO TABS: 1.0000 | ORAL_TABLET | Freq: Two times a day (BID) | ORAL | 0 refills | Status: DC

## 2021-07-09 MED ORDER — CETIRIZINE HCL 10 MG PO TABS: 10.0000 mg | ORAL_TABLET | Freq: Every day | ORAL | 0 refills | Status: DC

## 2021-07-09 MED ORDER — ALBUTEROL SULFATE HFA 108 (90 BASE) MCG/ACT IN AERS: 1.0000 | INHALATION_SPRAY | Freq: Four times a day (QID) | RESPIRATORY_TRACT | 0 refills | Status: DC | PRN

## 2021-07-09 NOTE — ED Provider Notes (Addendum)
[facility]   MRN: [id number] DOB: October 17, 1990  Subjective:   Randy Molina is a 31 y.o. male presenting for 3-week history of persistent sinus congestion, chest congestion, coughing, body aches, malaise and fatigue.  Patient has a history of smoking for 10 years, has done 1 PPD.  He is working on quitting.  Has a history of asthma, ran out of his albuterol inhaler.  Needs a refill.  No current facility-administered medications for this encounter.  Current Outpatient Medications:    albuterol (VENTOLIN HFA) 108 (90 Base) MCG/ACT inhaler, Inhale 2 puffs into the lungs every 6 (six) hours as needed for wheezing or shortness of breath., Disp: 8 g, Rfl: 6   albuterol (VENTOLIN HFA) 108 (90 Base) MCG/ACT inhaler, Inhale 2 puffs into the lungs every 4 (four) hours as needed for wheezing or shortness of breath., Disp: 1 each, Rfl: 0   benzonatate (TESSALON) 100 MG capsule, Take 1 capsule (100 mg total) by mouth every 8 (eight) hours., Disp: 21 capsule, Rfl: 0   benzonatate (TESSALON) 100 MG capsule, Take 1 capsule (100 mg total) by mouth every 8 (eight) hours., Disp: 21 capsule, Rfl: 0   famotidine (PEPCID) 20 MG tablet, Take 1 tablet (20 mg total) by mouth 2 (two) times daily., Disp: 28 tablet, Rfl: 0   loratadine (CLARITIN) 10 MG tablet, Take 1 tablet (10 mg total) by mouth daily., Disp: 14 tablet, Rfl: 0   promethazine-dextromethorphan (PROMETHAZINE-DM) 6.25-15 MG/5ML syrup, Take 5 mLs by mouth 4 (four) times daily as needed for cough., Disp: 118 mL, Rfl: 0   No Known Allergies  Past Medical History:  Diagnosis Date   Asthma    Not currently on medications; pending establishment of primary care   Current smoker    1.5 to 2 pack/day     Past Surgical History:  Procedure Laterality Date   None      Family History  Problem Relation Age of Onset   Lung cancer Mother 52   Cancer Father 90       Started off and jaw, recurred after resection    Social History    Tobacco Use   Smoking status: Every Day    Types: Cigarettes   Smokeless tobacco: Never  Vaping Use   Vaping Use: Never used  Substance Use Topics   Alcohol use: Yes    Comment: occ   Drug use: Never    ROS   Objective:   Vitals: BP 105/65 (BP Location: Right Arm)   Pulse 60   Temp 97.8 F (36.6 C) (Oral)   Resp 16   SpO2 97%   Physical Exam Constitutional:      General: He is not in acute distress.    Appearance: Normal appearance. He is well-developed and normal weight. He is not ill-appearing, toxic-appearing or diaphoretic.  HENT:     Head: Normocephalic and atraumatic.     Right Ear: Tympanic membrane, ear canal and external ear normal. There is no impacted cerumen.     Left Ear: Tympanic membrane, ear canal and external ear normal. There is no impacted cerumen.     Nose: Congestion and rhinorrhea present.     Mouth/Throat:     Mouth: Mucous membranes are moist.     Pharynx: No oropharyngeal exudate or posterior oropharyngeal erythema.  Eyes:     General: No scleral icterus.       Right eye: No discharge.        Left eye: No  discharge.     Extraocular Movements: Extraocular movements intact.     Conjunctiva/sclera: Conjunctivae normal.  Cardiovascular:     Rate and Rhythm: Normal rate and regular rhythm.     Heart sounds: Normal heart sounds. No murmur heard.    No friction rub. No gallop.  Pulmonary:     Effort: Pulmonary effort is normal. No respiratory distress.     Breath sounds: No stridor. No wheezing, rhonchi or rales.     Comments: Slight decrease in lung sounds over bibasilar fields. Musculoskeletal:     Cervical back: Normal range of motion and neck supple. No rigidity. No muscular tenderness.  Neurological:     General: No focal deficit present.     Mental Status: He is alert and oriented to person, place, and time.  Psychiatric:        Mood and Affect: Mood normal.        Behavior: Behavior normal.        Thought Content: Thought content  normal.     Assessment and Plan :   PDMP not reviewed this encounter.  1. Acute non-recurrent sinusitis of other sinus   2. Sinus congestion   3. Subacute cough   4. Chest congestion   5. Smoker   6. Mild persistent asthma without complication    X-ray over-read was pending at time of discharge, recommended follow up with only abnormal results. Otherwise will not call for negative over-read. Patient was in agreement. Will start empiric treatment for sinusitis with Augmentin.  I refilled his albuterol inhaler and in the context of his asthma recommended taking oral prednisone.  Start Zyrtec long-term daily.  Establish care with a new PCP through [facility] family [facility] center.  Counseled patient on potential for adverse effects with medications prescribed/recommended today, ER and return-to-clinic precautions discussed, patient verbalized understanding.    Wallis Bamberg, PA-C 07/09/21 1123   UPDATE: CLINICAL DATA: Cough and chest congestion.  EXAM: CHEST - 2 VIEW  COMPARISON: 02/17/2021  FINDINGS: Cardiomediastinal silhouette is normal. Mediastinal contours appear intact.  There is diffusely present subtle peribronchial thickening. No lobar consolidation seen. No pleural effusion.  Osseous structures are without acute abnormality. Soft tissues are grossly normal.  IMPRESSION: Diffuse subtle peribronchial thickening, which may be seen with bronchitis/reactive airway disease, or early atypical pneumonia.   Electronically Signed By: Ted Mcalpine M.D. On: 07/09/2021 11:55   No change to treatment plan but I did advise the patient follow-up with [place] in 4 weeks for repeat chest x-ray.   Wallis Bamberg, [place] 07/09/21 1304

## 2021-07-09 NOTE — ED Triage Notes (Signed)
Pt here with nasal congestion, generalized body aches, and nasal and chest congestion x 3 weeks.

## 2021-07-10 NOTE — Progress Notes (Deleted)
Primary Care Provider: Default, Provider, MD Cardiologist: Glenetta Hew, MD Electrophysiologist: None  Clinic Note: No chief complaint on file.  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 male with a PMH below who presents today for delayed 72-monthfollow-up to discuss results of test..  ATyrees Chopinwas seen on January 31, 2021 with multiple complaints.  Noting his exertional and resting dyspnea.  He had been to the emergency room with complaints of shortness of breath moving a mattress out of an apartment.  It took him a long time for her to recover.  Ultimately chronic cough.  No PND, orthopnea or edema.  Occasional fast heart rates.  Off-and-on chest discomfort. ROS: positive for - chest pain, dyspnea on exertion, palpitations, shortness of breath, and lightheadedness and dizziness with near syncope GXT and Echocardiogram Ordered.  Recent Hospitalizations:  03/02/2021: ER visit for acute cough, laryngitis.  Nasal congestion.  Now smoking half pack of cigarettes a day as opposed to 2 packs a day. 07/11/2021-[facility]-worsening congestion, cough, body aches malaise fatigue.  Diagnosed with acute sinusitis.  Treated with oral prednisone and Augmentin.  Reviewed  CV studies:    The following studies were reviewed today: (if available, images/films reviewed: From Epic Chart or Care Everywhere) TTE 02/08/2021: Increased trabeculation from mid to apical portion of the left ventricle (does not meet ratio of >2:1 to be called noncompaction)-recommend cardiac MRI.  EF estimated 60 to 65%.  Normal RV normal valves. GXT 02/08/2021: Excellent exercise capacity-12.5 METS- only reached 77% MPHR. - No chest pain. ==> At max tolerated exercise, no evidence of ischemia would be  relatively consistent with no myocardial ischemia despite not reaching 85% MPHR. Cardiac MRI 04/11/2021: LVEF 57%.  No RWMA.  Normal BiV size.  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 => Increased trabeculations is a benign finding that could lead to EKG abnormality.  Interval History:   Randy Molina  CV Review of Symptoms (Summary) Cardiovascular ROS: {roscv:310661}  Cardiovascular ROS: positive for - chest pain, dyspnea on exertion, palpitations, shortness of breath, and lightheadedness and dizziness with near syncope negative for - edema, irregular heartbeat, loss of consciousness, orthopnea, paroxysmal nocturnal dyspnea, or TIA/amaurosis fugax, claudication     REVIEWED OF SYSTEMS   ROS HENT:  Positive for sore throat. Negative for congestion.   Respiratory:  Positive for cough (Chronic) and wheezing (None recently but does have intermittent episodes). Negative for sputum production.   Cardiovascular:  Negative for leg swelling.  Gastrointestinal:  Negative for blood in stool and melena.  Genitourinary:  Negative for hematuria.  Musculoskeletal:  Positive for joint pain.       Bilateral leg pain from knee to calves and shins   Neurological:  Positive for dizziness. Negative for focal weakness.       Occasional near syncope  Psychiatric/Behavioral:  Negative for depression and memory loss. The patient is nervous/anxious. The patient does not have insomnia.     I have reviewed and (if needed) personally updated the patient's problem list, medications, allergies, past medical and surgical history, social and family history.   PAST MEDICAL HISTORY   Past Medical History:  Diagnosis Date   Asthma    Not currently on medications; pending establishment of primary care   Current  smoker    1.5 to 2 pack/day    PAST SURGICAL HISTORY   Past Surgical History:  Procedure Laterality Date   None      Immunization  History  Administered Date(s) Administered   PFIZER(Purple Top)SARS-COV-2 Vaccination 05/17/2019, 06/09/2019    MEDICATIONS/ALLERGIES   No outpatient medications have been marked as taking for the 07/11/21 encounter (Appointment) with Leonie Man, MD.    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   They have recently moved from [place] roughly 2 years ago to be closer to his family-on his father side.  Both of his parents have deceased.  Mother died at age 13 when he was age 53.  She apparently had lung cancer.  Father died recently at age 84.      He had not met his family on the father side until his father's death.  He subsequently decided to move up to [place] to be closer to family.      He works seasonally with a [employer].  He does ground and equipment work.  Currently off work because of the wintertime.      He smokes up to 2 packs a day depending on level of the anxiety.    OBJCTIVE -PE, EKG, labs   Wt Readings from Last 3 Encounters:  03/02/21 150 lb (68 kg)  01/31/21 153 lb (69.4 kg)  01/17/21 153 lb (69.4 kg)    Physical Exam: There were no vitals taken for this visit. Physical Exam Vitals reviewed.  Constitutional:      General: He is not in acute distress.    Appearance: Normal appearance. He is normal weight. He is not ill-appearing or toxic-appearing.  HENT:     Head: Normocephalic and atraumatic.  Neck:     Vascular: No carotid bruit.  Cardiovascular:     Rate and Rhythm: Regular rhythm. Bradycardia  present.     Pulses: Normal pulses.     Heart sounds: Normal heart sounds. No murmur heard.    No friction rub. No gallop.  Pulmonary:     Effort: Pulmonary effort is normal. No respiratory distress.     Breath sounds: Normal breath sounds. No wheezing, rhonchi or rales.  Musculoskeletal:     Cervical back: Normal range of motion and neck supple.  Skin:    General: Skin is warm and dry.  Neurological:     General: No focal deficit present.     Mental Status: He is alert and oriented to person, place, and time.     Cranial Nerves: No cranial nerve deficit.     Gait: Gait normal.  Psychiatric:        Mood and Affect: Mood normal.        Behavior: Behavior normal.        Thought Content: Thought content normal.        Judgment: Judgment normal.     Adult ECG Report  Rate: *** ;  Rhythm: {  rhythm:17366};   Narrative Interpretation: ***  Recent Labs:  ***  No results found for: "CHOL", "HDL", "LDLCALC", "LDLDIRECT", "TRIG", "CHOLHDL" Lab Results  Component Value Date   CREATININE 1.26 (H) 01/17/2021   BUN 20 01/17/2021   NA 137 01/17/2021   K 3.6 01/17/2021   CL 104 01/17/2021   CO2 24 01/17/2021      Latest Ref Rng & Units 02/22/2021    7:51 AM 01/17/2021    7:54 PM  CBC  WBC 3.4 - 10.8 x10E3/uL 10.9  8.1   Hemoglobin 13.0 - 17.7 g/dL 13.8  13.1   Hematocrit 37.5 - 51.0 % 41.4  38.3   Platelets 150 - 450 x10E3/uL 176  168     No results found for: "HGBA1C" No results found for: "TSH"  ==================================================  COVID-19 Education: The signs and symptoms of COVID-19 were discussed with the patient and how to seek care for testing (follow up with PCP or arrange E-visit).    I spent a total of ***minutes with the patient spent in direct patient consultation.  Additional time spent with chart review  / charting (studies, outside notes, etc): *** min Total Time: *** min  Current medicines are reviewed at length with the patient today.  (+/-  concerns) ***  This visit occurred during the SARS-CoV-2 public health emergency.  Safety protocols were in place, including screening questions prior to the visit, additional usage of staff PPE, and extensive cleaning of exam room while observing appropriate contact time as indicated for disinfecting solutions.  Notice: This dictation was prepared with Dragon dictation along with smart phrase technology. Any transcriptional errors that result from this process are unintentional and may not be corrected upon review.  Studies Ordered:   No orders of the defined types were placed in this encounter.  No orders of the defined types were placed in this encounter.   Patient Instructions / Medication Changes & Studies & Tests Ordered   There are no Patient Instructions on file for this visit.     Glenetta Hew, M.D., M.S. Interventional Cardiologist   Pager # [phone] Phone # [phone] [address] [place], [place] [address]   Thank you for choosing Heartcare at [facility]!!

## 2021-07-11 ENCOUNTER — Ambulatory Visit: Payer: Medicare Other | Admitting: Cardiology

## 2021-07-11 ENCOUNTER — Encounter: Payer: Self-pay | Admitting: Cardiology

## 2021-07-11 DIAGNOSIS — I428 Other cardiomyopathies: Secondary | ICD-10-CM

## 2021-07-15 ENCOUNTER — Encounter: Payer: Self-pay | Admitting: Cardiology

## 2021-07-19 ENCOUNTER — Ambulatory Visit (INDEPENDENT_AMBULATORY_CARE_PROVIDER_SITE_OTHER): Payer: Medicare Other | Admitting: Family Medicine

## 2021-07-19 ENCOUNTER — Encounter: Payer: Self-pay | Admitting: Family Medicine

## 2021-07-19 VITALS — BP 106/67 | HR 68 | Temp 97.9°F | Resp 12 | Ht 66.0 in | Wt 153.0 lb

## 2021-07-19 DIAGNOSIS — B9789 Other viral agents as the cause of diseases classified elsewhere: Secondary | ICD-10-CM | POA: Diagnosis not present

## 2021-07-19 DIAGNOSIS — J028 Acute pharyngitis due to other specified organisms: Secondary | ICD-10-CM | POA: Diagnosis not present

## 2021-07-19 MED ORDER — PSEUDOEPHEDRINE HCL ER 120 MG PO TB12: 120.0000 mg | ORAL_TABLET | Freq: Two times a day (BID) | ORAL | 0 refills | Status: AC

## 2021-07-19 MED ORDER — BENZONATATE 100 MG PO CAPS: 100.0000 mg | ORAL_CAPSULE | Freq: Two times a day (BID) | ORAL | 0 refills | Status: DC | PRN

## 2021-07-19 MED ORDER — AZITHROMYCIN 250 MG PO TABS: ORAL_TABLET | ORAL | 0 refills | Status: DC

## 2021-07-19 MED ORDER — IBUPROFEN 400 MG PO TABS: 400.0000 mg | ORAL_TABLET | Freq: Four times a day (QID) | ORAL | 0 refills | Status: DC | PRN

## 2021-07-19 NOTE — Progress Notes (Signed)
Randy Molina is a 31 y.o. male presenting with a sore throat for 2 days.  Associated symptoms include:  muscle aches and stuffy nose .  Symptoms are constant.  Home treatment thus far includes:  rest and OTC sore throat / cold products.  No known sick contacts with similar symptoms.  There is no history of of similar symptoms.  Pos 2022 Covid and Flu Vacc  PMH: asthma  PSHx: none  FMHx: Reviewed, no pertinent  Tobacco: not used currently  Exam:  BP 106/67   Pulse 68   Temp 97.9 F (36.6 C)   Resp 12   Ht 5\' 6"  (1.676 m)   Wt 153 lb (69.4 kg)   SpO2 99%   BMI 24.69 kg/m   Physical Exam Vitals and nursing note reviewed.  Constitutional:      Appearance: Normal appearance. He is normal weight. He is not ill-appearing.  HENT:     Head: Normocephalic and atraumatic.     Right Ear: Tympanic membrane, ear canal and external ear normal.     Left Ear: Tympanic membrane, ear canal and external ear normal.     Nose: Nose normal. No congestion.     Mouth/Throat:     Mouth: Mucous membranes are moist.     Pharynx: Oropharynx is clear. Posterior oropharyngeal erythema present. No pharyngeal swelling or oropharyngeal exudate.  Eyes:     General: No scleral icterus.    Extraocular Movements: Extraocular movements intact.     Conjunctiva/sclera: Conjunctivae normal.     Pupils: Pupils are equal, round, and reactive to light.  Cardiovascular:     Rate and Rhythm: Normal rate and regular rhythm.     Pulses: Normal pulses.     Heart sounds: Normal heart sounds.  Pulmonary:     Effort: Pulmonary effort is normal. No respiratory distress.     Breath sounds: Normal breath sounds.  Abdominal:     General: Abdomen is flat. Bowel sounds are normal. There is no distension.  Musculoskeletal:     Cervical back: Normal range of motion.  Skin:    General: Skin is warm.     Capillary Refill: Capillary refill takes less than 2 seconds.     Findings: No rash.  Neurological:      General: No focal deficit present.     Mental Status: He is alert and oriented to person, place, and time.     Cranial Nerves: No cranial nerve deficit.     Motor: No weakness.  Psychiatric:        Mood and Affect: Mood normal.        Behavior: Behavior normal.        Thought Content: Thought content normal.        Judgment: Judgment normal.      Rylin was seen today for sore throat.  Diagnoses and all orders for this visit:  Sore throat (viral) -     ibuprofen (ADVIL) 400 MG tablet; Take 1 tablet (400 mg total) by mouth every 6 (six) hours as needed. -     azithromycin (ZITHROMAX) 250 MG tablet; Use as directed -     pseudoephedrine (SUDAFED 12 HOUR) 120 MG 12 hr tablet; Take 1 tablet (120 mg total) by mouth 2 (two) times daily for 14 days. -     benzonatate (TESSALON) 100 MG capsule; Take 1 capsule (100 mg total) by mouth 2 (two) times daily as needed for cough.  RTC prn    COVID antigen test  was negative  Haydee Salter, MD

## 2021-07-19 NOTE — Patient Instructions (Signed)
Sore Throat When you have a sore throat, your throat may feel: Tender. Burning. Irritated. Scratchy. Painful when you swallow. Painful when you talk. Many things can cause a sore throat, such as: An infection. Allergies. Dry air. Smoke or pollution. Radiation treatment for cancer. Gastroesophageal reflux disease (GERD). A tumor. A sore throat can be the first sign of another sickness. It can happen with other problems, like: Coughing. Sneezing. Fever. Swelling of the glands in the neck. Most sore throats go away without treatment. Follow these instructions at home:     Medicines Take over-the-counter and prescription medicines only as told by your doctor. Children often get sore throats. Do not give your child aspirin. Use throat sprays to soothe your throat as told by your health care provider. Managing pain To help with pain: Sip warm liquids, such as broth, herbal tea, or warm water. Eat or drink cold or frozen liquids, such as frozen ice pops. Rinse your mouth (gargle) with a salt water mixture 3-4 times a day or as needed. To make salt water, dissolve -1 tsp (3-6 g) of salt in 1 cup (237 mL) of warm water. Do not swallow this mixture. Suck on hard candy or throat lozenges. Put a cool-mist humidifier in your bedroom at night. Sit in the bathroom with the door closed for 5-10 minutes while you run hot water in the shower. General instructions Do not smoke or use any products that contain nicotine or tobacco. If you need help quitting, ask your doctor. Get plenty of rest. Drink enough fluid to keep your pee (urine) pale yellow. Wash your hands often for at least 20 seconds with soap and water. If soap and water are not available, use hand sanitizer. Contact a doctor if: You have a fever for more than 2-3 days. You keep having symptoms for more than 2-3 days. Your throat does not get better in 7 days. You have a fever and your symptoms suddenly get worse. Your  child who is 3 months to 3 years old has a temperature of 102.2F (39C) or higher. Get help right away if: You have trouble breathing. You cannot swallow fluids, soft foods, or your spit. You have swelling in your throat or neck that gets worse. You feel like you may vomit (nauseous) and this feeling lasts a long time. You cannot stop vomiting. These symptoms may be an emergency. Get help right away. Call your local emergency services (911 in the [place] Do not wait to see if the symptoms will go away. Do not drive yourself to the hospital. Summary A sore throat is a painful, burning, irritated, or scratchy throat. Many things can cause a sore throat. Take over-the-counter medicines only as told by your doctor. Get plenty of rest. Drink enough fluid to keep your pee (urine) pale yellow. Contact a doctor if your symptoms get worse or your sore throat does not get better within 7 days. This information is not intended to replace advice given to you by your health care provider. Make sure you discuss any questions you have with your health care provider. Document Revised: 03/17/2020 Document Reviewed: 03/17/2020 Elsevier Patient Education  2023 [employer] Inc[employer]  

## 2021-08-23 NOTE — Progress Notes (Deleted)
Cardiology Office Note:    Date:  08/23/2021   ID:  Randy Molina, DOB 1990-07-15, MRN [id number]  PCP:  Elwin Mocha, [place] Cardiologist: Glenetta Hew, MD   Reason for visit: Follow-up on echo and stress test.  History of Present Illness:    Randy Molina is a 31 y.o. male with a hx of abnormal EKG, tobacco use and asthma.  Family history of CAD.  He saw Dr. Ellyn Hack in January 2023 with dyspnea on exertion leading to ED visit and intermittent chest pain.  With anterior T wave inversions, echo and stress test were ordered.  Echo showed EF 60 to 65%, normal RV, no significant valve disease.  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  Cardiac MRI did not meet criteria for noncompaction; increased trabeculations considered benign.  ETT showed excellent exercise capacity without EKG changes, arrhythmias or symptoms.  77% max heart rate.  Tobacco use  Today, ***  Dyspnea on exertion Asthma -***  Precordial pain -***  Hypertension -*** -Goal BP is <130/80.  Recommend DASH diet (high in vegetables, fruits, low-fat dairy products, whole grains, poultry, fish, and nuts and low in sweets, sugar-sweetened beverages, and red meats), salt restriction and increase physical activity.  Hyperlipidemia -*** -Discussed cholesterol lowering diets - Mediterranean diet, DASH diet, vegetarian diet, low-carbohydrate diet and avoidance of trans fats.  Discussed healthier choice substitutes.  Nuts, high-fiber foods, and fiber supplements may also improve lipids.    Tobacco use  -Recommend tobacco cessation.  Reviewed physiologic effects of nicotine and the immediate-eventual benefits of quitting including improvement in cough/breathing and reduction in cardiovascular events.  Discussed quitting tips such as removing triggers and getting support from family/friends and Quitline [place]. -USPSTF recommends one-time screening for  abdominal aortic aneurysm (AAA) by ultrasound in men 71 -45 years old who have ever smoked.      Disposition - Follow-up in ***     Past Medical History:  Diagnosis Date   Asthma    Not currently on medications; pending establishment of primary care   Current smoker    1.5 to 2 pack/day    Past Surgical History:  Procedure Laterality Date   Cardiac MRI Bilateral 04/11/2021   LVEF 57%.  No RWMA.  Normal BiV size.  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 => Increased trabeculations is a benign finding that could lead to EKG abnormality.   Graduated Exercise Tolerance Test  02/08/2021   Excellent exercise capacity-12.5 METS- only reached 77% MPHR. - No chest pain. ==> At max tolerated exercise, no evidence of ischemia would be relatively consistent with no myocardial ischemia despite not reaching 85% MPHR.   None     TRANSTHORACIC ECHOCARDIOGRAM  02/08/2021   Increased trabeculation from mid to apical portion of the left ventricle (does not meet ratio of >2:1 to be called noncompaction)-recommend cardiac MRI.  EF estimated 60 to 65%.  Normal RV normal valves.    Current Medications: No outpatient medications have been marked as taking for the 08/24/21 encounter (Appointment) with Warren Lacy, PA-C.     Allergies:   Patient has no known allergies.   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   They have recently moved from [place] roughly 2 years ago to be closer to his family-on his father side.  Both of his parents have deceased.  Mother died at age 60 when he was age 31.  She apparently had lung cancer.  Father died recently at age 20.      He had not met his family on the father side until his father's death.  He subsequently decided to move up to [place] to be closer to family.      He works seasonally with a [employer].  He does ground and equipment work.  Currently off work because of the wintertime.      He smokes up to 2 packs a day depending on level of the anxiety.   Social Determinants of Health   Financial Resource Strain: Not on file  Food Insecurity: Not on file  Transportation Needs: Not on file  Physical Activity: Not on file  Stress: Not on file  Social Connections: Not on file     Family History: The patient's family history includes Cancer (age of onset: 76) in his father; Lung cancer (age of onset: 14) in his mother.  ROS:   Please see the history of present illness.     EKGs/Labs/Other Studies Reviewed:    EKG:  The ekg ordered today demonstrates ***  Recent Labs: 01/17/2021: BUN 20; Creatinine, Ser 1.26; Potassium 3.6; Sodium 137 02/22/2021: Hemoglobin 13.8; Platelets 176   Recent Lipid Panel No results found for: "CHOL", "TRIG", "HDL", "LDLCALC", "LDLDIRECT"  Physical Exam:    VS:  There were no vitals taken for this visit.   No data found.  No BP recorded.  {Refresh Note OR Click here to enter BP  :1}***    Wt Readings from Last 3 Encounters:  07/19/21 153 lb (69.4 kg)  03/02/21 150 lb (68 kg)  01/31/21 153 lb (69.4 kg)     GEN: *** Well nourished, well developed in no acute distress HEENT: Normal NECK: No  JVD; No carotid bruits CARDIAC: ***RRR, no murmurs, rubs, gallops RESPIRATORY:  Clear to auscultation without rales, wheezing or rhonchi  ABDOMEN: Soft, non-tender, non-distended MUSCULOSKELETAL: No edema; No deformity  SKIN: Warm and dry NEUROLOGIC:  Alert and oriented PSYCHIATRIC:  Normal affect     ASSESSMENT AND PLAN   ***   {Are you ordering a CV Procedure (e.g. stress test, cath, DCCV, TEE, etc)?   Press F2        :[id number]}    Medication Adjustments/Labs and Tests Ordered: Current medicines are reviewed at length with the patient today.  Concerns regarding medicines are outlined above.  No orders of the defined types were placed in this encounter.  No orders of the defined types were placed in this encounter.   There are no Patient Instructions on file for this visit.   Signed, Warren Lacy, PA-C  08/23/2021 3:19 PM    [facility]

## 2021-08-24 ENCOUNTER — Ambulatory Visit: Payer: Medicare Other | Admitting: Physician Assistant

## 2021-08-24 DIAGNOSIS — R072 Precordial pain: Secondary | ICD-10-CM

## 2021-08-24 DIAGNOSIS — Z720 Tobacco use: Secondary | ICD-10-CM

## 2021-08-24 DIAGNOSIS — R0609 Other forms of dyspnea: Secondary | ICD-10-CM

## 2021-09-13 ENCOUNTER — Ambulatory Visit
Admission: EM | Admit: 2021-09-13 | Discharge: 2021-09-13 | Disposition: A | Discharge status: Home / Self Care | Payer: Medicare Other | Attending: Emergency Medicine | Admitting: Emergency Medicine

## 2021-09-13 DIAGNOSIS — B349 Viral infection, unspecified: Secondary | ICD-10-CM | POA: Diagnosis present

## 2021-09-13 DIAGNOSIS — Z20822 Contact with and (suspected) exposure to covid-19: Secondary | ICD-10-CM | POA: Diagnosis not present

## 2021-09-13 DIAGNOSIS — R058 Other specified cough: Secondary | ICD-10-CM | POA: Diagnosis present

## 2021-09-13 DIAGNOSIS — Z79899 Other long term (current) drug therapy: Secondary | ICD-10-CM | POA: Insufficient documentation

## 2021-09-13 LAB — SARS CORONAVIRUS 2 BY RT PCR: SARS Coronavirus 2 by RT PCR: NEGATIVE

## 2021-09-13 MED ORDER — PROMETHAZINE-DM 6.25-15 MG/5ML PO SYRP: 5.0000 mL | ORAL_SOLUTION | Freq: Four times a day (QID) | ORAL | 0 refills | Status: DC | PRN

## 2021-09-13 NOTE — ED Triage Notes (Signed)
Pt states woke up at 4am coughing. Pt c/o cough, runny nose, and sore throat. States his daughter has strep throat. States took OTC meds this am with relief.

## 2021-09-13 NOTE — ED Provider Notes (Signed)
[facility]    CSN: [id number] Arrival date & time: 09/13/21  1615    HISTORY   Chief Complaint  Patient presents with   Cough   Sore Throat   HPI Randy Molina is a pleasant, 31 y.o. male who presents to urgent care today. Patient states last night he woke up with a dry cough.  Patient states this morning, along with the cough he noticed that he has a very sore throat, pain with swallowing or runny nose.  Patient also reports a headache which is worsened with cough.  Patient states he has been feeling more tired than usual today which is why he did not go to work.  Patient states his daughter tested positive for strep throat 2 days ago and has been on antibiotics since she was diagnosed.  Patient states he took some over-the-counter cough and cold medications without meaningful relief of his symptoms.  Patient states he needs a note to return to work.    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  No RWMA.  Normal BiV size.  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 => Increased trabeculations is a benign finding that could lead to EKG abnormality.   Graduated Exercise Tolerance Test  02/08/2021   Excellent exercise capacity-12.5 METS- only reached 77% MPHR. - No chest pain. ==> At max tolerated exercise, no evidence of ischemia would be relatively consistent with no myocardial ischemia despite not reaching 85% MPHR.   None      TRANSTHORACIC ECHOCARDIOGRAM  02/08/2021   Increased trabeculation from mid to apical portion of the left ventricle (does not meet ratio of >2:1 to be called noncompaction)-recommend cardiac MRI.  EF estimated 60 to 65%.  Normal RV normal valves.    Home Medications    Prior to Admission medications   Not on File    Family History Family History  Problem Relation Age of Onset   Lung cancer Mother 26   Cancer Father 9       Started off and jaw, recurred after resection   Social History Social History   Tobacco Use   Smoking status: Former    Types: Cigarettes   Smokeless tobacco: Never  Vaping Use   Vaping Use: Never used  Substance Use Topics   Alcohol use: Yes    Comment: occ   Drug use: Never   Allergies   Patient has no known allergies.  Review of Systems Review of Systems Pertinent findings revealed after performing a 14 point review of systems has been noted in the history of present illness.  Physical Exam Triage Vital Signs ED Triage Vitals  Enc Vitals Group     BP 10/29/20 0827 (!) 147/82     Pulse Rate 10/29/20 0827 72     Resp 10/29/20 0827 18     Temp 10/29/20 0827 98.3 F (36.8 C)     Temp Source 10/29/20 0827 Oral     SpO2  10/29/20 0827 98 %     Weight --      Height --      Head Circumference --      Peak Flow --      Pain Score 10/29/20 0826 5     Pain Loc --      Pain Edu? --      Excl. in GC? --   No data found.  Updated Vital Signs BP 104/65 (BP Location: Left Arm)   Pulse 63   Temp 98.9 F (37.2 C) (Oral)   Resp 18   SpO2 98%   Physical Exam Vitals and nursing note reviewed.  Constitutional:      General: He is not in acute distress.    Appearance: Normal appearance. He is not ill-appearing.  HENT:     Head: Normocephalic and atraumatic.     Salivary Glands: Right salivary gland is not diffusely enlarged or tender. Left salivary gland is not diffusely enlarged or tender.     Right Ear: Tympanic membrane, ear canal and  external ear normal. No drainage. No middle ear effusion. There is no impacted cerumen. Tympanic membrane is not erythematous or bulging.     Left Ear: Tympanic membrane, ear canal and external ear normal. No drainage.  No middle ear effusion. There is no impacted cerumen. Tympanic membrane is not erythematous or bulging.     Nose: Nose normal. No nasal deformity, septal deviation, mucosal edema, congestion or rhinorrhea.     Right Turbinates: Not enlarged, swollen or pale.     Left Turbinates: Not enlarged, swollen or pale.     Right Sinus: No maxillary sinus tenderness or frontal sinus tenderness.     Left Sinus: No maxillary sinus tenderness or frontal sinus tenderness.     Mouth/Throat:     Lips: Pink. No lesions.     Mouth: Mucous membranes are moist. No oral lesions.     Pharynx: Oropharynx is clear. Uvula midline. No posterior oropharyngeal erythema or uvula swelling.     Tonsils: No tonsillar exudate. 0 on the right. 0 on the left.  Eyes:     General: Lids are normal.        Right eye: No discharge.        Left eye: No discharge.     Extraocular Movements: Extraocular movements intact.     Conjunctiva/sclera: Conjunctivae normal.     Right eye: Right conjunctiva is not injected.     Left eye: Left conjunctiva is not injected.  Neck:     Trachea: Trachea and phonation normal.  Cardiovascular:     Rate and Rhythm: Normal rate and regular rhythm.     Pulses: Normal pulses.     Heart sounds: Normal heart sounds. No murmur heard.    No friction rub. No gallop.  Pulmonary:     Effort: Pulmonary effort is normal. No accessory muscle usage, prolonged expiration or respiratory distress.     Breath sounds: Normal breath sounds. No stridor, decreased air movement or transmitted upper airway sounds. No decreased breath sounds, wheezing, rhonchi or rales.  Chest:     Chest wall: No tenderness.  Musculoskeletal:        General: Normal range of motion.     Cervical back: Normal range of  motion and neck supple. Normal range of motion.  Lymphadenopathy:     Cervical: No cervical adenopathy.  Skin:    General: Skin is warm and dry.     Findings: No erythema or  rash.  Neurological:     General: No focal deficit present.     Mental Status: He is alert and oriented to person, place, and time.  Psychiatric:        Mood and Affect: Mood normal.        Behavior: Behavior normal.     Visual Acuity Right Eye Distance:   Left Eye Distance:   Bilateral Distance:    Right Eye Near:   Left Eye Near:    Bilateral Near:     UC Couse / Diagnostics / Procedures:     Radiology No results found.  Procedures Procedures (including critical care time) EKG  Pending results:  Labs Reviewed  SARS CORONAVIRUS 2 BY RT PCR    Medications Ordered in UC: Medications - No data to display  UC Diagnoses / Final Clinical Impressions(s)   I have reviewed the triage vital signs and the nursing notes.  Pertinent labs & imaging results that were available during my care of the patient were reviewed by me and considered in my medical decision making (see chart for details).    Final diagnoses:  Viral illness  Nonproductive cough   COVID-19 testing performed.  Patient provided with a note to return to work tomorrow if his COVID-19 test is negative, encourage patient to sign up for MyChart so that he can show his employer his negative result.  Patient further advised that if his COVID-19 test is positive, because his symptoms began yesterday will need to remain out of work until Thursday.  Supportive care recommended, Promethazine DM provided for cough.  Return precautions advised.  ED Prescriptions     Medication Sig Dispense Auth. Provider   promethazine-dextromethorphan (PROMETHAZINE-DM) 6.25-15 MG/5ML syrup Take 5 mLs by mouth 4 (four) times daily as needed for cough. 118 mL Theadora Rama Scales, PA-C      PDMP not reviewed this encounter.  Disposition Upon Discharge:   Condition: stable for discharge home Home: take medications as prescribed; routine discharge instructions as discussed; follow up as advised.  Patient presented with an acute illness with associated systemic symptoms and significant discomfort requiring urgent management. In my opinion, this is a condition that a prudent lay person (someone who possesses an average knowledge of health and medicine) may potentially expect to result in complications if not addressed urgently such as respiratory distress, impairment of bodily function or dysfunction of bodily organs.   Routine symptom specific, illness specific and/or disease specific instructions were discussed with the patient and/or caregiver at length.   As such, the patient has been evaluated and assessed, work-up was performed and treatment was provided in alignment with urgent care protocols and evidence based medicine.  Patient/parent/caregiver has been advised that the patient may require follow up for further testing and treatment if the symptoms continue in spite of treatment, as clinically indicated and appropriate.  If the patient was tested for COVID-19, Influenza and/or RSV, then the patient/parent/guardian was advised to isolate at home pending the results of his/her diagnostic coronavirus test and potentially longer if they're positive. I have also advised pt that if his/her COVID-19 test returns positive, it's recommended to self-isolate for at least 10 days after symptoms first appeared AND until fever-free for 24 hours without fever reducer AND other symptoms have improved or resolved. Discussed self-isolation recommendations as well as instructions for household member/close contacts as per the [facility] and [place] DHHS, and also gave patient the COVID packet with this information.  Patient/parent/caregiver has been advised to return  to the [facility] or PCP in 3-5 days if no better; to PCP or the Emergency Department if new signs and symptoms develop,  or if the current signs or symptoms continue to change or worsen for further workup, evaluation and treatment as clinically indicated and appropriate  The patient will follow up with their current PCP if and as advised. If the patient does not currently have a PCP we will assist them in obtaining one.   The patient may need specialty follow up if the symptoms continue, in spite of conservative treatment and management, for further workup, evaluation, consultation and treatment as clinically indicated and appropriate.  Patient/parent/caregiver verbalized understanding and agreement of plan as discussed.  All questions were addressed during visit.  Please see discharge instructions below for further details of plan.  Discharge Instructions:   Discharge Instructions      Your symptoms and physical exam findings are concerning for a viral respiratory infection.  The result of your viral PCR testing will be posted to your MyChart once it is complete, typically this takes 6 to 12 hours.    If your COVID-19 PCR test is positive, you will be contacted by phone.  Please discuss with the callback nurse whether or not you would benefit from antiviral therapy treatment for COVID-19.  You will need to remain out of work for 3 full days to avoid spreading COVID-19 to other people in your community.    If your COVID-19 test is negative, you are likely suffering from a virus that is circulating in our community right now that will have to resolve on its own.   Please see the list below for recommended medications, dosages and frequencies to provide relief of your current symptoms:     Advil, Motrin (ibuprofen): This is a good anti-inflammatory medication which addresses aches, pains and inflammation of the upper airways that causes sinus and nasal congestion as well as in the lower airways which makes your cough feel tight and sometimes burn.  I recommend that you take between 400 to 600 mg every 6-8 hours as  needed.      Promethazine DM: Promethazine is both a nasal decongestant and an antinausea medication that makes most patients feel fairly sleepy.  The DM is dextromethorphan, a cough suppressant found in many over-the-counter cough medications.  Please take 5 mL before bedtime to minimize your cough which will help you sleep better.  I have sent a prescription for this medication to your pharmacy.   Chloraseptic Throat Spray: Spray 5 sprays into affected area every 2 hours, hold for 15 seconds and either swallow or spit it out.  This is a excellent numbing medication because it is a spray, you can put it right where you needed and so sucking on a lozenge and numbing your entire mouth.      Please follow-up within the next 5-7 days either with your primary care provider or urgent care if your symptoms do not resolve.  If you do not have a primary care provider, we will assist you in finding one.   Thank you for visiting urgent care today.  We appreciate the opportunity to participate in your care.       This office note has been dictated using Teaching laboratory technician.  Unfortunately, this method of dictation can sometimes lead to typographical or grammatical errors.  I apologize for your inconvenience in advance if this occurs.  Please do not hesitate to reach out to me if clarification is  needed.      Theadora Rama Scales, PA-C 09/13/21 1726

## 2021-09-13 NOTE — Discharge Instructions (Signed)
Your symptoms and physical exam findings are concerning for a viral respiratory infection.  The result of your viral PCR testing will be posted to your MyChart once it is complete, typically this takes 6 to 12 hours.    If your COVID-19 PCR test is positive, you will be contacted by phone.  Please discuss with the callback nurse whether or not you would benefit from antiviral therapy treatment for COVID-19.  You will need to remain out of work for 3 full days to avoid spreading COVID-19 to other people in your community.    If your COVID-19 test is negative, you are likely suffering from a virus that is circulating in our community right now that will have to resolve on its own.   Please see the list below for recommended medications, dosages and frequencies to provide relief of your current symptoms:     Advil, Motrin (ibuprofen): This is a good anti-inflammatory medication which addresses aches, pains and inflammation of the upper airways that causes sinus and nasal congestion as well as in the lower airways which makes your cough feel tight and sometimes burn.  I recommend that you take between 400 to 600 mg every 6-8 hours as needed.      Promethazine DM: Promethazine is both a nasal decongestant and an antinausea medication that makes most patients feel fairly sleepy.  The DM is dextromethorphan, a cough suppressant found in many over-the-counter cough medications.  Please take 5 mL before bedtime to minimize your cough which will help you sleep better.  I have sent a prescription for this medication to your pharmacy.   Chloraseptic Throat Spray: Spray 5 sprays into affected area every 2 hours, hold for 15 seconds and either swallow or spit it out.  This is a excellent numbing medication because it is a spray, you can put it right where you needed and so sucking on a lozenge and numbing your entire mouth.      Please follow-up within the next 5-7 days either with your primary care provider or  urgent care if your symptoms do not resolve.  If you do not have a primary care provider, we will assist you in finding one.   Thank you for visiting urgent care today.  We appreciate the opportunity to participate in your care.

## 2021-09-19 ENCOUNTER — Other Ambulatory Visit: Payer: Self-pay

## 2021-09-19 ENCOUNTER — Encounter (HOSPITAL_BASED_OUTPATIENT_CLINIC_OR_DEPARTMENT_OTHER): Payer: Self-pay | Admitting: Emergency Medicine

## 2021-09-19 ENCOUNTER — Emergency Department (HOSPITAL_BASED_OUTPATIENT_CLINIC_OR_DEPARTMENT_OTHER): Payer: Medicare Other

## 2021-09-19 ENCOUNTER — Emergency Department (HOSPITAL_BASED_OUTPATIENT_CLINIC_OR_DEPARTMENT_OTHER)
Admission: EM | Admit: 2021-09-19 | Discharge: 2021-09-19 | Disposition: A | Discharge status: Home / Self Care | Payer: Medicare Other | Attending: Emergency Medicine | Admitting: Emergency Medicine

## 2021-09-19 DIAGNOSIS — E876 Hypokalemia: Secondary | ICD-10-CM | POA: Diagnosis not present

## 2021-09-19 DIAGNOSIS — J4521 Mild intermittent asthma with (acute) exacerbation: Secondary | ICD-10-CM | POA: Insufficient documentation

## 2021-09-19 DIAGNOSIS — D72829 Elevated white blood cell count, unspecified: Secondary | ICD-10-CM | POA: Insufficient documentation

## 2021-09-19 DIAGNOSIS — Z20822 Contact with and (suspected) exposure to covid-19: Secondary | ICD-10-CM | POA: Insufficient documentation

## 2021-09-19 DIAGNOSIS — Z87891 Personal history of nicotine dependence: Secondary | ICD-10-CM | POA: Insufficient documentation

## 2021-09-19 DIAGNOSIS — R059 Cough, unspecified: Secondary | ICD-10-CM | POA: Diagnosis present

## 2021-09-19 DIAGNOSIS — Z7951 Long term (current) use of inhaled steroids: Secondary | ICD-10-CM | POA: Insufficient documentation

## 2021-09-19 LAB — BASIC METABOLIC PANEL
Anion gap: 8 (ref 5–15)
BUN: 19 mg/dL (ref 6–20)
CO2: 26 mmol/L (ref 22–32)
Calcium: 9.2 mg/dL (ref 8.9–10.3)
Chloride: 104 mmol/L (ref 98–111)
Creatinine, Ser: 1 mg/dL (ref 0.61–1.24)
GFR, Estimated: >60 mL/min (ref >60)
Glucose, Bld: 97 mg/dL (ref 70–99)
Potassium: 3.4 mmol/L — ABNORMAL LOW (ref 3.5–5.1)
Sodium: 138 mmol/L (ref 135–145)

## 2021-09-19 LAB — CBC WITH DIFFERENTIAL/PLATELET
Abs Immature Granulocytes: 0.07 10*3/uL (ref 0.00–0.07)
Basophils Absolute: 0 10*3/uL (ref 0.0–0.1)
Basophils Relative: 0 %
Eosinophils Absolute: 0 10*3/uL (ref 0.0–0.5)
Eosinophils Relative: 0 %
HCT: 37.1 % — ABNORMAL LOW (ref 39.0–52.0)
Hemoglobin: 12.7 g/dL — ABNORMAL LOW (ref 13.0–17.0)
Immature Granulocytes: 1 %
Lymphocytes Relative: 34 %
Lymphs Abs: 4 10*3/uL (ref 0.7–4.0)
MCH: 30.8 pg (ref 26.0–34.0)
MCHC: 34.2 g/dL (ref 30.0–36.0)
MCV: 90 fL (ref 80.0–100.0)
Monocytes Absolute: 0.9 10*3/uL (ref 0.1–1.0)
Monocytes Relative: 7 %
Neutro Abs: 6.7 10*3/uL (ref 1.7–7.7)
Neutrophils Relative %: 58 %
Platelets: 185 10*3/uL (ref 150–400)
RBC: 4.12 MIL/uL — ABNORMAL LOW (ref 4.22–5.81)
RDW: 13 % (ref 11.5–15.5)
WBC: 11.7 10*3/uL — ABNORMAL HIGH (ref 4.0–10.5)
nRBC: 0 % (ref 0.0–0.2)

## 2021-09-19 LAB — RESP PANEL BY RT-PCR (FLU A&B, COVID) ARPGX2
Influenza A by PCR: NEGATIVE
Influenza B by PCR: NEGATIVE
SARS Coronavirus 2 by RT PCR: NEGATIVE

## 2021-09-19 MED ORDER — METHYLPREDNISOLONE SODIUM SUCC 125 MG IJ SOLR
125.0000 mg | Freq: Once | INTRAMUSCULAR | Status: AC
  Administered 2021-09-19: 125 mg via INTRAVENOUS
  Filled 2021-09-19: qty 2

## 2021-09-19 MED ORDER — ALBUTEROL SULFATE HFA 108 (90 BASE) MCG/ACT IN AERS: 1.0000 | INHALATION_SPRAY | Freq: Four times a day (QID) | RESPIRATORY_TRACT | 0 refills | Status: DC | PRN

## 2021-09-19 MED ORDER — PREDNISONE 20 MG PO TABS: 40.0000 mg | ORAL_TABLET | Freq: Every day | ORAL | 0 refills | Status: DC

## 2021-09-19 MED ORDER — PREDNISONE 20 MG PO TABS: 40.0000 mg | ORAL_TABLET | Freq: Every day | ORAL | 0 refills | Status: AC

## 2021-09-19 MED ORDER — IPRATROPIUM-ALBUTEROL 0.5-2.5 (3) MG/3ML IN SOLN
3.0000 mL | Freq: Once | RESPIRATORY_TRACT | Status: AC
  Administered 2021-09-19: 3 mL via RESPIRATORY_TRACT
  Filled 2021-09-19: qty 3

## 2021-09-19 MED ORDER — IPRATROPIUM-ALBUTEROL 0.5-2.5 (3) MG/3ML IN SOLN
3.0000 mL | RESPIRATORY_TRACT | Status: DC | PRN
  Administered 2021-09-19 (×2): 3 mL via RESPIRATORY_TRACT
  Filled 2021-09-19: qty 6

## 2021-09-19 NOTE — ED Triage Notes (Signed)
Patient presents to ED via POV from home. Here with 2 week history of sore throat, cough and shortness of breath.

## 2021-09-19 NOTE — Discharge Instructions (Addendum)
You were evaluated in the Emergency Department and after careful evaluation, we did not find any emergent condition requiring admission or further testing in the hospital.  Please use the albuterol and take the steroid course as directed until finished.  Please return to the Emergency Department if you experience any worsening of your condition.  We encourage you to follow up with a primary care provider.  Thank you for allowing [place] to be a part of your care.

## 2021-09-19 NOTE — Progress Notes (Deleted)
Cardiology Office Note:    Date:  09/19/2021   ID:  Randy Molina, DOB [birth year], MRN [id number]  PCP:  Elwin Mocha, MD [place] Cardiologist: Glenetta Hew, MD  Reason for visit: follow-up on echo and ETT  History of Present Illness:    Randy Molina is a 31 y.o. male with a hx of asthma, tobacco use, and chronic cough and shortness of breath.    He last saw Dr. Ellyn Hack in January 2023.  He was noted to have significant exacerbations of his baseline shortness of breath at rest and with exertion, requiring hospitalizations.  He occasionally felt his heart rate going fast sometimes associated with lightheadedness.  He also mentioned intermittent brief chest discomfort, not necessarily worse with exertion.  EKG showed anterior T wave inversions.  2D echo and exercise stress test ordered.  Echo showed EF 60 to 65%, normal RV, no significant valve disease.  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  Cardiac MRI did not meet criteria for noncompaction; increased trabeculations considered benign.  ETT showed excellent exercise capacity without EKG changes, arrhythmias or symptoms.  77% max heart rate.  Tobacco use  Today, ***  Dyspnea on exertion Asthma -***  Precordial pain -***  Hypertension -*** -Goal BP is <130/80.  Recommend DASH diet (high in vegetables, fruits, low-fat dairy products, whole grains, poultry, fish, and nuts and low in sweets, sugar-sweetened beverages, and red meats), salt restriction and increase physical activity.  Hyperlipidemia -*** -Discussed cholesterol lowering diets - Mediterranean diet, DASH diet, vegetarian diet, low-carbohydrate diet and avoidance of trans fats.  Discussed healthier choice substitutes.  Nuts, high-fiber foods, and fiber supplements may also improve lipids.    Tobacco use  -Recommend tobacco cessation.  Reviewed physiologic effects of nicotine and the  immediate-eventual benefits of quitting including improvement in cough/breathing and reduction in cardiovascular events.  Discussed quitting tips such as removing triggers and getting support from family/friends and Quitline [place]. -USPSTF recommends one-time screening for abdominal aortic aneurysm (AAA) by ultrasound in men 69 -74 years old who have ever smoked.      Disposition - Follow-up in ***    Past Medical History:  Diagnosis Date   Asthma    Not currently on medications; pending establishment of primary care   Current smoker    1.5 to 2 pack/day    Past Surgical History:  Procedure Laterality Date   Cardiac MRI Bilateral 04/11/2021   LVEF 57%.  No RWMA.  Normal BiV size.  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 => Increased trabeculations is a benign finding that could lead to EKG abnormality.   Graduated Exercise Tolerance Test  02/08/2021   Excellent exercise capacity-12.5 METS- only reached 77% MPHR. - No chest pain. ==> At max tolerated exercise, no evidence of ischemia would be relatively consistent with no myocardial ischemia despite not reaching 85% MPHR.   None     TRANSTHORACIC ECHOCARDIOGRAM  02/08/2021   Increased trabeculation from mid to apical portion of the left ventricle (does not meet ratio of >2:1 to be called noncompaction)-recommend cardiac MRI.  EF estimated 60 to 65%.  Normal RV normal valves.    Current Medications: No outpatient medications have been marked as taking for the 09/21/21 encounter (Appointment) with Warren Lacy, PA-C.     Allergies:   Patient has no known allergies.   Social History   Socioeconomic History   Marital status: Domestic  Partner    Spouse name: Marella Chimes   Number of children: 4   Years of education: Not on file   Highest education level: Not on file  Occupational History   Occupation: Building Development worker, community driving and working on  roads/asphalt    Comment: Oceanographer  Tobacco Use   Smoking status: Former    Types: Cigarettes   Smokeless tobacco: Never  Vaping Use   Vaping Use: Never used  Substance and Sexual Activity   Alcohol use: Yes    Comment: occ   Drug use: Never   Sexual activity: Not on file  Other Topics Concern   Not on file  Social History Narrative   Autumn is with his long-term partner Janus Molder -> they have 4 children ages 31, 69, 22 and 7.   They have recently moved from [place] roughly 2 years ago to be closer to his family-on his father side.  Both of his parents have deceased.  Mother died at age 2 when he was age 42.  She apparently had lung cancer.  Father died recently at age 59.      He had not met his family on the father side until his father's death.  He subsequently decided to move up to [place] to be closer to family.      He works seasonally with a [employer].  He does ground and equipment work.  Currently off work because of the wintertime.      He smokes up to 2 packs a day depending on level of the anxiety.   Social Determinants of Health   Financial Resource Strain: Not on file  Food Insecurity: Not on file  Transportation Needs: Not on file  Physical Activity: Not on file  Stress: Not on file  Social Connections: Not on file     Family History: The patient's family history includes Cancer (age of onset: 61) in his father; Lung cancer (age of onset: 19) in his mother.  ROS:   Please see the history of present illness.     EKGs/Labs/Other Studies Reviewed:    EKG:  The ekg ordered today demonstrates ***  Recent Labs: 01/17/2021: BUN 20; Creatinine, Ser 1.26; Potassium 3.6; Sodium 137 02/22/2021: Hemoglobin 13.8; Platelets 176   Recent Lipid Panel No results found for: "CHOL", "TRIG", "HDL", "LDLCALC", "LDLDIRECT"  Physical Exam:    VS:  There were no vitals taken for this visit.   No data found.  No BP  recorded.  {Refresh Note OR Click here to enter BP  :1}***    Wt Readings from Last 3 Encounters:  03/02/21 150 lb (68 kg)  01/31/21 153 lb (69.4 kg)  01/17/21 153 lb (69.4 kg)     GEN: *** Well nourished, well developed in no acute distress HEENT: Normal NECK: No JVD; No carotid bruits CARDIAC: ***RRR, no murmurs, rubs, gallops RESPIRATORY:  Clear to auscultation without rales, wheezing or rhonchi  ABDOMEN: Soft, non-tender, non-distended MUSCULOSKELETAL: No edema; No deformity  SKIN: Warm and dry NEUROLOGIC:  Alert and oriented PSYCHIATRIC:  Normal affect     ASSESSMENT AND PLAN   ***   {Are you ordering a CV Procedure (e.g. stress test, cath, DCCV, TEE, etc)?   Press F2        :[id number]}    Medication Adjustments/Labs and Tests Ordered: Current medicines are reviewed at length with the patient today.  Concerns regarding medicines are outlined above.  No orders of the defined  types were placed in this encounter.  No orders of the defined types were placed in this encounter.   There are no Patient Instructions on file for this visit.   Signed, Warren Lacy, PA-C  09/19/2021 9:14 AM    [place]

## 2021-09-19 NOTE — ED Notes (Signed)
RT was unaware of PEFR order until after neb was given. MD aware

## 2021-09-19 NOTE — ED Provider Notes (Signed)
[place] EMERGENCY DEPARTMENT Provider Note   CSN: [id number] Arrival date & time: 09/19/21  0950     History  Chief Complaint  Patient presents with   Cough    Randy Molina is a 31 y.o. male.  HPI 31 year old male with a history of asthma who presents to the ER with complaints of cough for 2 weeks, sore throat, shortness of breath.  Patient was seen in urgent care, per chart review appears to have gotten Phenergan for cough.  He continues to cough.  He has a 10-year smoking history, 1 pack/day.  He has had his albuterol inhaler as well which he has been using but this has not been helping.  Endorses some chest tightness.  Cough has been productive.    Home Medications Prior to Admission medications   Medication Sig Start Date End Date Taking? Authorizing Provider  albuterol (VENTOLIN HFA) 108 (90 Base) MCG/ACT inhaler Inhale 1-2 puffs into the lungs every 6 (six) hours as needed for wheezing or shortness of breath. 09/19/21  Yes Garald Balding, PA-C  predniSONE (DELTASONE) 20 MG tablet Take 2 tablets (40 mg total) by mouth daily for 5 days. 09/19/21 09/24/21 Yes Garald Balding, PA-C  promethazine-dextromethorphan (PROMETHAZINE-DM) 6.25-15 MG/5ML syrup Take 5 mLs by mouth 4 (four) times daily as needed for cough. 09/13/21   Lynden Oxford Scales, PA-C      Allergies    Patient has no known allergies.    Review of Systems   Review of Systems Ten systems reviewed and are negative for acute change, except as noted in the HPI.    Physical Exam Updated Vital Signs BP 115/65 (BP Location: Right Arm)   Pulse 66 Comment: NSR  Temp 98 F (36.7 C) (Oral)   Resp 20   SpO2 100%  Physical Exam Vitals and nursing note reviewed.  Constitutional:      General: He is not in acute distress.    Appearance: He is well-developed.  HENT:     Head: Normocephalic and atraumatic.  Eyes:     Conjunctiva/sclera: Conjunctivae normal.  Cardiovascular:     Rate and Rhythm:  Normal rate and regular rhythm.     Heart sounds: No murmur heard. Pulmonary:     Effort: Pulmonary effort is normal. No respiratory distress.     Breath sounds: Wheezing present.     Comments: Lung sounds diminished with scattered wheezes.  Patient actively coughing. Abdominal:     Palpations: Abdomen is soft.     Tenderness: There is no abdominal tenderness.  Musculoskeletal:        General: No swelling.     Cervical back: Neck supple.  Skin:    General: Skin is warm and dry.     Capillary Refill: Capillary refill takes less than 2 seconds.  Neurological:     Mental Status: He is alert.  Psychiatric:        Mood and Affect: Mood normal.     ED Results / Procedures / Treatments   Labs (all labs ordered are listed, but only abnormal results are displayed) Labs Reviewed  BASIC METABOLIC PANEL - Abnormal; Notable for the following components:      Result Value   Potassium 3.4 (*)    All other components within normal limits  CBC WITH DIFFERENTIAL/PLATELET - Abnormal; Notable for the following components:   WBC 11.7 (*)    RBC 4.12 (*)    Hemoglobin 12.7 (*)    HCT 37.1 (*)  All other components within normal limits  RESP PANEL BY RT-PCR (FLU A&B, COVID) [id number]    EKG EKG Interpretation  Date/Time:  Monday September 19 2021 10:45:17 EDT Ventricular Rate:  68 PR Interval:  135 QRS Duration: 70 QT Interval:  385 QTC Calculation: 410 R Axis:   51 Text Interpretation: Sinus rhythm Abnrm T, probable ischemia, anterolateral lds No significant change since last tracing Confirmed by Melene Plan [phone]) on 09/19/2021 10:51:15 AM  Radiology DG Chest Portable 1 View  Result Date: 09/19/2021 CLINICAL DATA:  Two week history of cough. EXAM: PORTABLE CHEST 1 VIEW COMPARISON:  07/09/2021 FINDINGS: The cardiac silhouette, mediastinal and hilar contours are normal. The lungs are clear. No pleural effusions. No pulmonary lesions. No pneumothorax. The bony thorax is intact.  IMPRESSION: No acute cardiopulmonary findings. Electronically Signed   By: Rudie Meyer M.D.   On: 09/19/2021 10:41    Procedures Procedures    Medications Ordered in ED Medications  ipratropium-albuterol (DUONEB) 0.5-2.5 (3) MG/3ML nebulizer solution 3 mL (3 mLs Nebulization Given 09/19/21 1104)  methylPREDNISolone sodium succinate (SOLU-MEDROL) 125 mg/2 mL injection 125 mg (125 mg Intravenous Given 09/19/21 1044)  ipratropium-albuterol (DUONEB) 0.5-2.5 (3) MG/3ML nebulizer solution 3 mL (3 mLs Nebulization Given 09/19/21 1036)    ED Course/ Medical Decision Making/ A&P Clinical Course as of 09/19/21 1141  Mon Sep 19, 2021  3027 31 year old male presenting with 2 weeks of cough, shortness of breath and chest pressure.  On arrival, he is nontoxic but appears uncomfortable and coughing.  He has diminished breath sounds and scattered wheezes on physical exam.  His vitals are overall reassuring and he is not hypoxic.  We will obtain blood work, COVID test, will give several DuoNeb treatments and Solu-Medrol and reassess. Chest x-ray  ordered, reviewed, negative for acute findings. EKG ordered, reviewed, no ischemic changes [MB]  1125 Work-up overall reassuring.  Labs ordered, reviewed and interpreted by me.  CBC with a mild leukocytosis of 11.7, BMP with mild hypokalemia 3.4.  COVID and flu are negative.  Chest x-ray reviewed, agree with radiology read, negative for acute findings EKG reviewed, nonischemic [MB]  1137 On reevaluation, patient notes significant improvement with DuoNeb treatments.  Suspect asthma exacerbation.  Will treat with albuterol and a course of steroids.  Patient is agreeable to this.  We discussed return precautions.  Stable for discharge. [MB]    Clinical Course User Index [MB] Mare Ferrari, PA-C                           Medical Decision Making Amount and/or Complexity of Data Reviewed Labs: ordered. Radiology: ordered.  Risk Prescription drug  management.    Final Clinical Impression(s) / ED Diagnoses Final diagnoses:  Mild intermittent asthma with acute exacerbation    Rx / DC Orders ED Discharge Orders          Ordered    albuterol (VENTOLIN HFA) 108 (90 Base) MCG/ACT inhaler  Every 6 hours PRN        09/19/21 1141    predniSONE (DELTASONE) 20 MG tablet  Daily        09/19/21 1141              Mare Ferrari, PA-C 09/19/21 1141    Melene Plan, DO 09/19/21 1301

## 2021-09-20 DIAGNOSIS — Z720 Tobacco use: Secondary | ICD-10-CM | POA: Insufficient documentation

## 2021-09-21 ENCOUNTER — Ambulatory Visit: Payer: Medicare Other | Admitting: Physician Assistant

## 2021-10-13 ENCOUNTER — Ambulatory Visit: Payer: Medicare Other | Attending: Cardiology | Admitting: Nurse Practitioner

## 2021-10-13 NOTE — Progress Notes (Deleted)
Office Visit    Patient Name: Randy Molina Date of Encounter: 10/13/2021  Primary Care Provider:  Pcp, No Primary Cardiologist:  Glenetta Hew, MD  Chief Complaint    31 year old male with a history of dyspnea on exertion, abnormal EKG, lateral well and apical trabeculation, asthma, and tobacco use who presents for follow-up related to dyspnea on exertion.  Past Medical History    Past Medical History:  Diagnosis Date   Asthma    Not currently on medications; pending establishment of primary care   Current smoker    1.5 to 2 pack/day   Past Surgical History:  Procedure Laterality Date   Cardiac MRI Bilateral 04/11/2021   LVEF 57%.  No RWMA.  Normal BiV size.  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 => Increased trabeculations is a benign finding that could lead to EKG abnormality.   Graduated Exercise Tolerance Test  02/08/2021   Excellent exercise capacity-12.5 METS- only reached 77% MPHR. - No chest pain. ==> At max tolerated exercise, no evidence of ischemia would be relatively consistent with no myocardial ischemia despite not reaching 85% MPHR.   None     TRANSTHORACIC ECHOCARDIOGRAM  02/08/2021   Increased trabeculation from mid to apical portion of the left ventricle (does not meet ratio of >2:1 to be called noncompaction)-recommend cardiac MRI.  EF estimated 60 to 65%.  Normal RV normal valves.    Allergies  No Known Allergies  History of Present Illness    31 year old male with the above past medical history including dyspnea on exertion, abnormal EKG, lateral well and apical trabeculation, asthma, and tobacco use.  He was initially evaluated by Dr. Ellyn Hack in January 2023 with dyspnea on exertion, chest pain, nonspecific EKG changes (anterior t wave inversions).  ETT was normal, revealed excellent exercise capacity. Echocardiogram showed EF 64%, normal LV function, no RWMA, normal RV systolic function,  no significant valvular abnormalities, increased trabeculae from mid to apical portion of the left ventricle, did not meet criteria for compacted myocardium.  Follow-up cardiac MRI revealed EF 57%, prominent lateral wall and apical trabeculations, not meeting criteria for noncompaction cardiomyopathy, overall, considered a benign finding.  He was evaluated in the ED in September 2023 with acute asthma exacerbation.  He was given inhalers and discharged home in stable condition.  He presents today for follow-up. Since his last visit  Dyspnea on exertion: Apical trabeculations: Asthma: Tobacco use: Disposition:  Home Medications    Current Outpatient Medications  Medication Sig Dispense Refill   albuterol (VENTOLIN HFA) 108 (90 Base) MCG/ACT inhaler Inhale 1-2 puffs into the lungs every 6 (six) hours as needed for wheezing or shortness of breath. 1 each 0   promethazine-dextromethorphan (PROMETHAZINE-DM) 6.25-15 MG/5ML syrup Take 5 mLs by mouth 4 (four) times daily as needed for cough. 118 mL 0   No current facility-administered medications for this visit.     Review of Systems    ***.  All other systems reviewed and are otherwise negative except as noted above.    Physical Exam    VS:  There were no vitals taken for this visit. , BMI There is no height or weight on file to calculate BMI.     GEN: Well nourished, well developed, in no acute distress. HEENT: normal. Neck: Supple, no JVD, carotid bruits, or masses. Cardiac: RRR, no murmurs, rubs, or gallops. No clubbing, cyanosis, edema.  Radials/DP/PT 2+ and equal bilaterally.  Respiratory:  Respirations regular and unlabored, clear  to auscultation bilaterally. GI: Soft, nontender, nondistended, BS + x 4. MS: no deformity or atrophy. Skin: warm and dry, no rash. Neuro:  Strength and sensation are intact. Psych: Normal affect.  Accessory Clinical Findings    ECG personally reviewed by me today - *** - no acute changes.   Lab  Results  Component Value Date   WBC 11.7 (H) 09/19/2021   HGB 12.7 (L) 09/19/2021   HCT 37.1 (L) 09/19/2021   MCV 90.0 09/19/2021   PLT 185 09/19/2021   Lab Results  Component Value Date   CREATININE 1.00 09/19/2021   BUN 19 09/19/2021   NA 138 09/19/2021   K 3.4 (L) 09/19/2021   CL 104 09/19/2021   CO2 26 09/19/2021   No results found for: "ALT", "AST", "GGT", "ALKPHOS", "BILITOT" No results found for: "CHOL", "HDL", "LDLCALC", "LDLDIRECT", "TRIG", "CHOLHDL"  No results found for: "HGBA1C"  Assessment & Plan    1.  ***  No BP recorded.  {Refresh Note OR Click here to enter BP  :1}***   Joylene Grapes, NP 10/13/2021, 5:34 AM

## 2022-01-03 ENCOUNTER — Ambulatory Visit (HOSPITAL_COMMUNITY): Payer: Self-pay

## 2022-03-07 ENCOUNTER — Encounter (HOSPITAL_COMMUNITY): Payer: Self-pay | Admitting: Emergency Medicine

## 2022-03-07 ENCOUNTER — Other Ambulatory Visit: Payer: Self-pay

## 2022-03-07 ENCOUNTER — Emergency Department (HOSPITAL_COMMUNITY): Payer: Medicare Other

## 2022-03-07 ENCOUNTER — Emergency Department (HOSPITAL_COMMUNITY)
Admission: EM | Admit: 2022-03-07 | Discharge: 2022-03-07 | Disposition: A | Discharge status: Home / Self Care | Payer: Medicare Other | Attending: Emergency Medicine | Admitting: Emergency Medicine

## 2022-03-07 DIAGNOSIS — Z1152 Encounter for screening for COVID-19: Secondary | ICD-10-CM | POA: Insufficient documentation

## 2022-03-07 DIAGNOSIS — J209 Acute bronchitis, unspecified: Secondary | ICD-10-CM | POA: Diagnosis not present

## 2022-03-07 DIAGNOSIS — F17200 Nicotine dependence, unspecified, uncomplicated: Secondary | ICD-10-CM | POA: Insufficient documentation

## 2022-03-07 DIAGNOSIS — R059 Cough, unspecified: Secondary | ICD-10-CM | POA: Diagnosis present

## 2022-03-07 LAB — RESP PANEL BY RT-PCR (RSV, FLU A&B, COVID)  RVPGX2
Influenza A by PCR: NEGATIVE
Influenza B by PCR: NEGATIVE
Resp Syncytial Virus by PCR: NEGATIVE
SARS Coronavirus 2 by RT PCR: NEGATIVE

## 2022-03-07 MED ORDER — AZITHROMYCIN 250 MG PO TABS: 250.0000 mg | ORAL_TABLET | Freq: Every day | ORAL | 0 refills | Status: DC

## 2022-03-07 MED ORDER — IPRATROPIUM-ALBUTEROL 0.5-2.5 (3) MG/3ML IN SOLN
3.0000 mL | Freq: Once | RESPIRATORY_TRACT | Status: AC
  Administered 2022-03-07: 3 mL via RESPIRATORY_TRACT
  Filled 2022-03-07: qty 3

## 2022-03-07 MED ORDER — PREDNISONE 20 MG PO TABS: 60.0000 mg | ORAL_TABLET | Freq: Every day | ORAL | 0 refills | Status: DC

## 2022-03-07 NOTE — Discharge Instructions (Signed)
You are seen in the emergency department for cough and headache.  Your COVID and flu test were negative and your chest x-ray did not show any pneumonia.  We are treating you with antibiotics and steroids for bronchitis.  Please drink plenty of fluids and rest.  Return to the emergency department if any worsening or concerning symptoms.

## 2022-03-07 NOTE — ED Triage Notes (Signed)
Patient arrives ambulatory by POV c/o cough, trouble breathing and headache x 1 week. Cough is dry. Patient has tried cough and flu meds. Patient speaking in full sentences.

## 2022-03-07 NOTE — ED Provider Notes (Signed)
[facility] AT [facility] Provider Note   CSN: [id number] Arrival date & time: 03/07/22  1616     History {Add pertinent medical, surgical, social history, OB history to HPI:1} Chief Complaint  Patient presents with   Cough   Headache    Randy Molina is a 32 y.o. male.  He is here with complaint of 1 week of headache cough body aches.  Cough has been productive with just some clear sputum.  He has been using his inhaler without any improvement.  He continues to smoke.  He is not sure if there is been a fever.  Using over-the-counter medications without improvement.  Denies sick contacts or recent travel.  The history is provided by the patient.  Influenza Presenting symptoms: cough, fatigue, headache, myalgias and shortness of breath   Presenting symptoms: no diarrhea and no fever   Cough:    Cough characteristics:  Productive   Sputum characteristics:  Nondescript   Severity:  Moderate   Duration:  1 week   Timing:  Intermittent   Progression:  Unchanged   Chronicity:  New Headaches:    Severity:  Severe   Onset quality:  Gradual   Duration:  1 week   Timing:  Constant   Progression:  Unchanged   Chronicity:  New      Home Medications Prior to Admission medications   Medication Sig Start Date End Date Taking? Authorizing Provider  albuterol (VENTOLIN HFA) 108 (90 Base) MCG/ACT inhaler Inhale 1-2 puffs into the lungs every 6 (six) hours as needed for wheezing or shortness of breath. 09/19/21   Garald Balding, PA-C  promethazine-dextromethorphan (PROMETHAZINE-DM) 6.25-15 MG/5ML syrup Take 5 mLs by mouth 4 (four) times daily as needed for cough. 09/13/21   Lynden Oxford Scales, PA-C      Allergies    Patient has no known allergies.    Review of Systems   Review of Systems  Constitutional:  Positive for fatigue. Negative for fever.  Eyes:  Negative for visual disturbance.  Respiratory:  Positive for cough and shortness of breath.    Gastrointestinal:  Negative for diarrhea.  Musculoskeletal:  Positive for myalgias.  Neurological:  Positive for headaches.    Physical Exam Updated Vital Signs BP 124/73 (BP Location: Left Arm)   Pulse 98   Temp 98.7 F (37.1 C) (Oral)   Resp 20   Ht '5\' 7"'$  (1.702 m)   Wt 70.3 kg   SpO2 100%   BMI 24.28 kg/m  Physical Exam Vitals and nursing note reviewed.  Constitutional:      General: He is not in acute distress.    Appearance: Normal appearance. He is well-developed.  HENT:     Head: Normocephalic and atraumatic.  Eyes:     Conjunctiva/sclera: Conjunctivae normal.  Cardiovascular:     Rate and Rhythm: Normal rate and regular rhythm.     Heart sounds: No murmur heard. Pulmonary:     Effort: Pulmonary effort is normal. No respiratory distress.     Breath sounds: Normal breath sounds. No wheezing.  Abdominal:     Palpations: Abdomen is soft.     Tenderness: There is no abdominal tenderness. There is no guarding or rebound.  Musculoskeletal:        General: No deformity.     Cervical back: Neck supple.     Right lower leg: No edema.     Left lower leg: No edema.  Skin:    General: Skin is warm  and dry.     Capillary Refill: Capillary refill takes less than 2 seconds.  Neurological:     General: No focal deficit present.     Mental Status: He is alert.     ED Results / Procedures / Treatments   Labs (all labs ordered are listed, but only abnormal results are displayed) Labs Reviewed - No data to display  EKG None  Radiology No results found.  Procedures Procedures  {Document cardiac monitor, telemetry assessment procedure when appropriate:1}  Medications Ordered in ED Medications  ipratropium-albuterol (DUONEB) 0.5-2.5 (3) MG/3ML nebulizer solution 3 mL (has no administration in time range)    ED Course/ Medical Decision Making/ A&P   {   Click here for ABCD2, HEART and other calculatorsREFRESH Note before signing :1}                           Medical Decision Making Amount and/or Complexity of Data Reviewed Radiology: ordered.  Risk Prescription drug management.   This patient complains of ***; this involves an extensive number of treatment Options and is a complaint that carries with it a high risk of complications and morbidity. The differential includes ***  I ordered, reviewed and interpreted labs, which included *** I ordered medication *** and reviewed PMP when indicated. I ordered imaging studies which included *** and I independently    visualized and interpreted imaging which showed *** Additional history obtained from *** Previous records obtained and reviewed *** I consulted *** and discussed lab and imaging findings and discussed disposition.  Cardiac monitoring reviewed, *** Social determinants considered, *** Critical Interventions: ***  After the interventions stated above, I reevaluated the patient and found *** Admission and further testing considered, ***   {Document critical care time when appropriate:1} {Document review of labs and clinical decision tools ie heart score, Chads2Vasc2 etc:1}  {Document your independent review of radiology images, and any outside records:1} {Document your discussion with family members, caretakers, and with consultants:1} {Document social determinants of health affecting pt's care:1} {Document your decision making why or why not admission, treatments were needed:1} Final Clinical Impression(s) / ED Diagnoses Final diagnoses:  None    Rx / DC Orders ED Discharge Orders     None

## 2022-03-07 NOTE — ED Notes (Signed)
Dc instructions and scripts reviewed with pt no questions or concerns at this time. Ambulated out of ed with steady gait.

## 2022-03-22 ENCOUNTER — Emergency Department (HOSPITAL_BASED_OUTPATIENT_CLINIC_OR_DEPARTMENT_OTHER)
Admission: EM | Admit: 2022-03-22 | Discharge: 2022-03-22 | Disposition: A | Discharge status: Home / Self Care | Payer: Medicare Other | Attending: Emergency Medicine | Admitting: Emergency Medicine

## 2022-03-22 ENCOUNTER — Other Ambulatory Visit: Payer: Self-pay

## 2022-03-22 DIAGNOSIS — M25512 Pain in left shoulder: Secondary | ICD-10-CM | POA: Insufficient documentation

## 2022-03-22 DIAGNOSIS — J029 Acute pharyngitis, unspecified: Secondary | ICD-10-CM | POA: Insufficient documentation

## 2022-03-22 DIAGNOSIS — M542 Cervicalgia: Secondary | ICD-10-CM | POA: Insufficient documentation

## 2022-03-22 LAB — GROUP A STREP BY PCR: Group A Strep by PCR: NOT DETECTED

## 2022-03-22 MED ORDER — METHYLPREDNISOLONE 4 MG PO TBPK: ORAL_TABLET | ORAL | 0 refills | Status: DC

## 2022-03-22 MED ORDER — CYCLOBENZAPRINE HCL 10 MG PO TABS: 10.0000 mg | ORAL_TABLET | Freq: Two times a day (BID) | ORAL | 0 refills | Status: DC | PRN

## 2022-03-22 NOTE — Discharge Instructions (Signed)
Recommend Tylenol for pain.  I have prescribed you muscle accidents and steroids which should help with your discomfort.  Do not mix alcohol or drugs with your muscle relaxant Flexeril/cyclobenzaprine.  This medication is sedating.  Do not drive if taking this medicine

## 2022-03-22 NOTE — ED Provider Notes (Signed)
[facility] AT [facility] Provider Note   CSN: [id number] Arrival date & time: 03/22/22  [id number]     History  Chief Complaint  Patient presents with   Neck Pain    Randy Molina is a 32 y.o. male.  Patient here with left-sided neck pain into his left shoulder.  Feels like muscle spasm.  He has been overcoming a cold recently.  Had a negative strep test recently.  Still has some sore throat.  Denies any fevers or chills.  No weakness or numbness or tingling.  No vision loss.  No chest pain or shortness of breath.  No significant medical history.  The history is provided by the patient.       Home Medications Prior to Admission medications   Medication Sig Start Date End Date Taking? Authorizing Provider  cyclobenzaprine (FLEXERIL) 10 MG tablet Take 1 tablet (10 mg total) by mouth 2 (two) times daily as needed for muscle spasms. 03/22/22  Yes Kellee Sittner, DO  methylPREDNISolone (MEDROL DOSEPAK) 4 MG TBPK tablet Follow package insert 03/22/22  Yes Effa Yarrow, DO  albuterol (VENTOLIN HFA) 108 (90 Base) MCG/ACT inhaler Inhale 1-2 puffs into the lungs every 6 (six) hours as needed for wheezing or shortness of breath. 09/19/21   Garald Balding, PA-C  azithromycin (ZITHROMAX) 250 MG tablet Take 1 tablet (250 mg total) by mouth daily. Take first 2 tablets together, then 1 every day until finished. 03/07/22   Hayden Rasmussen, MD  predniSONE (DELTASONE) 20 MG tablet Take 3 tablets (60 mg total) by mouth daily. 03/07/22   Hayden Rasmussen, MD  promethazine-dextromethorphan (PROMETHAZINE-DM) 6.25-15 MG/5ML syrup Take 5 mLs by mouth 4 (four) times daily as needed for cough. 09/13/21   Lynden Oxford Scales, PA-C      Allergies    Patient has no known allergies.    Review of Systems   Review of Systems  Physical Exam Updated Vital Signs BP 126/72   Pulse 77   Temp 98.3 F (36.8 C)   Resp 18   SpO2 100%  Physical Exam Vitals and nursing note  reviewed.  Constitutional:      General: He is not in acute distress.    Appearance: He is well-developed.  HENT:     Head: Normocephalic and atraumatic.     Mouth/Throat:     Mouth: Mucous membranes are moist.     Pharynx: No oropharyngeal exudate or posterior oropharyngeal erythema.  Eyes:     Extraocular Movements: Extraocular movements intact.     Conjunctiva/sclera: Conjunctivae normal.     Pupils: Pupils are equal, round, and reactive to light.  Cardiovascular:     Rate and Rhythm: Normal rate and regular rhythm.     Heart sounds: No murmur heard. Pulmonary:     Effort: Pulmonary effort is normal. No respiratory distress.     Breath sounds: Normal breath sounds.  Abdominal:     Palpations: Abdomen is soft.     Tenderness: There is no abdominal tenderness.  Musculoskeletal:        General: Tenderness present. No swelling.     Cervical back: Normal range of motion and neck supple.     Comments: No midline spinal tenderness, tenderness to the left-sided paracervical muscles on the left including the left trapezius with increased tone  Skin:    General: Skin is warm and dry.     Capillary Refill: Capillary refill takes less than 2 seconds.  Neurological:  General: No focal deficit present.     Mental Status: He is alert and oriented to person, place, and time.     Cranial Nerves: No cranial nerve deficit.     Sensory: No sensory deficit.     Motor: No weakness.     Coordination: Coordination normal.  Psychiatric:        Mood and Affect: Mood normal.     ED Results / Procedures / Treatments   Labs (all labs ordered are listed, but only abnormal results are displayed) Labs Reviewed  GROUP A STREP BY PCR    EKG None  Radiology No results found.  Procedures Procedures    Medications Ordered in ED Medications - No data to display  ED Course/ Medical Decision Making/ A&P                             Medical Decision Making Risk Prescription drug  management.   Randy Molina is here with left-sided neck pain and shoulder pain.  Normal vitals.  No fever.  No significant medical history.  Differential diagnosis muscular issue.  Have no concern for stroke or ACS or cardiac or pulmonary problem.  He still having sore throat after couple weeks.  Had negative strep test when this first started.  He is very well-appearing.  Have no concern for infectious process or meningitis except for may be strep throat.  Will reswab.  Will treat what I suspect is a muscle spasm with Medrol Dosepak and Flexeril.  He is neurovascular neuromuscular intact.  Denies any injury.  I do not think any imaging is needed at this time.  Discharged in good condition.  Understands return precautions.  Will send an antibiotic if strep test positive. This chart was dictated using voice recognition software.  Despite best efforts to proofread,  errors can occur which can change the documentation meaning.         Final Clinical Impression(s) / ED Diagnoses Final diagnoses:  Neck pain    Rx / DC Orders ED Discharge Orders          Ordered    methylPREDNISolone (MEDROL DOSEPAK) 4 MG TBPK tablet        03/22/22 0720    cyclobenzaprine (FLEXERIL) 10 MG tablet  2 times daily PRN        03/22/22 0720              Lennice Sites, DO 03/22/22 [id number]

## 2022-03-22 NOTE — ED Triage Notes (Signed)
Pt reports left side neck pain into shoulder that began just prior to arrival. Pt denies recent injury.

## 2022-04-22 ENCOUNTER — Ambulatory Visit
Admission: EM | Admit: 2022-04-22 | Discharge: 2022-04-22 | Disposition: A | Discharge status: Home / Self Care | Payer: Medicare Other | Attending: Internal Medicine | Admitting: Internal Medicine

## 2022-04-22 DIAGNOSIS — J310 Chronic rhinitis: Secondary | ICD-10-CM

## 2022-04-22 DIAGNOSIS — J208 Acute bronchitis due to other specified organisms: Secondary | ICD-10-CM | POA: Diagnosis not present

## 2022-04-22 MED ORDER — GUAIFENESIN ER 600 MG PO TB12: 600.0000 mg | ORAL_TABLET | Freq: Two times a day (BID) | ORAL | 0 refills | Status: AC

## 2022-04-22 MED ORDER — FLUTICASONE PROPIONATE 50 MCG/ACT NA SUSP: 1.0000 | Freq: Every day | NASAL | 0 refills | Status: DC

## 2022-04-22 NOTE — ED Provider Notes (Signed)
[facility]    CSN: [id number] Arrival date & time: 04/22/22  0902      History   Chief Complaint Chief Complaint  Patient presents with   Cough   Ear Pain   Nasal Congestion    HPI Randy Molina is a 32 y.o. male who recently quit smoking comes to urgent care with complaints of sore throat, ear ache and nasal congestion of 1 week duration.  Patient describes the cough as productive of thick yellowish sputum.  He denies any fever or chills.  No shortness of breath or wheezing.  No chest pain or chest tightness or tenderness chest pressure.  2 days ago he started experiencing right ear pain with no ear discharge.  He has not tried any over-the-counter medication.  He endorses sick contact with similar symptoms.     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  No RWMA.  Normal BiV size.  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 => Increased trabeculations is a benign finding that could lead to EKG abnormality.   Graduated Exercise Tolerance Test  02/08/2021   Excellent exercise capacity-12.5 METS- only reached 77% MPHR. - No chest pain. ==> At max tolerated exercise, no evidence of ischemia would be relatively consistent with no myocardial ischemia despite not reaching 85% MPHR.   None     TRANSTHORACIC ECHOCARDIOGRAM  02/08/2021   Increased trabeculation from mid to apical portion of the left  ventricle (does not meet ratio of >2:1 to be called noncompaction)-recommend cardiac MRI.  EF estimated 60 to 65%.  Normal RV normal valves.       Home Medications    Prior to Admission medications   Medication Sig Start Date End Date Taking? Authorizing Provider  fluticasone (FLONASE) 50 MCG/ACT nasal spray Place 1 spray into both nostrils daily. 04/22/22  Yes Garett Tetzloff, Britta Mccreedy, MD  guaiFENesin (MUCINEX) 600 MG 12 hr tablet Take 1 tablet (600 mg total) by mouth 2 (two) times daily for 10 days. 04/22/22 05/02/22 Yes Chenae Brager, Britta Mccreedy, MD  albuterol (VENTOLIN HFA) 108 (90 Base) MCG/ACT inhaler Inhale 1-2 puffs into the lungs every 6 (six) hours as needed for wheezing or shortness of breath. 09/19/21   Mare Ferrari, PA-C  azithromycin (ZITHROMAX) 250 MG tablet Take 1 tablet (250 mg total) by mouth daily. Take first 2 tablets together, then 1 every day until finished. 03/07/22   Terrilee Files, MD  cyclobenzaprine (FLEXERIL) 10 MG tablet Take 1 tablet (10 mg total) by mouth 2 (two) times daily as needed for muscle spasms. 03/22/22   Curatolo, Adam, DO  methylPREDNISolone (MEDROL DOSEPAK) 4 MG TBPK tablet Follow package insert 03/22/22   Curatolo, Adam, DO  predniSONE (DELTASONE) 20 MG tablet Take 3 tablets (60 mg total) by mouth daily. 03/07/22   Terrilee Files, MD  promethazine-dextromethorphan (PROMETHAZINE-DM) 6.25-15 MG/5ML syrup Take 5 mLs by mouth 4 (four) times  daily as needed for cough. 09/13/21   Theadora Rama Scales, PA-C    Family History Family History  Problem Relation Age of Onset   Lung cancer Mother 12   Cancer Father 31       Started off and jaw, recurred after resection    Social History Social History   Tobacco Use   Smoking status: Former    Types: Cigarettes   Smokeless tobacco: Never  Vaping Use   Vaping Use: Never used  Substance Use Topics   Alcohol use: Yes    Comment: occ   Drug use: Never     Allergies   Patient has no known allergies.   Review  of Systems Review of Systems As per HPI  Physical Exam Triage Vital Signs ED Triage Vitals  Enc Vitals Group     BP 04/22/22 0918 98/64     Pulse Rate 04/22/22 0918 61     Resp 04/22/22 0918 16     Temp 04/22/22 0918 98.2 F (36.8 C)     Temp Source 04/22/22 0918 Oral     SpO2 04/22/22 0918 98 %     Weight --      Height --      Head Circumference --      Peak Flow --      Pain Score 04/22/22 0917 0     Pain Loc --      Pain Edu? --      Excl. in GC? --    No data found.  Updated Vital Signs BP 98/64 (BP Location: Right Arm)   Pulse 61   Temp 98.2 F (36.8 C) (Oral)   Resp 16   SpO2 98%   Visual Acuity Right Eye Distance:   Left Eye Distance:   Bilateral Distance:    Right Eye Near:   Left Eye Near:    Bilateral Near:     Physical Exam Vitals and nursing note reviewed.  Constitutional:      General: He is not in acute distress.    Appearance: Normal appearance. He is not ill-appearing.  HENT:     Right Ear: Tympanic membrane normal.     Left Ear: Tympanic membrane normal.     Nose: Congestion present.     Mouth/Throat:     Mouth: Mucous membranes are moist.     Pharynx: No posterior oropharyngeal erythema.  Eyes:     Extraocular Movements: Extraocular movements intact.     Pupils: Pupils are equal, round, and reactive to light.  Cardiovascular:     Rate and Rhythm: Normal rate and regular rhythm.     Pulses: Normal pulses.     Heart sounds: Normal heart sounds.  Pulmonary:     Effort: Pulmonary effort is normal. No respiratory distress.     Breath sounds: Normal breath sounds. No stridor. No wheezing or rhonchi.  Musculoskeletal:     Cervical back: Normal range of motion.  Lymphadenopathy:     Cervical: No cervical adenopathy.  Neurological:     Mental Status: He is alert.      UC Treatments / Results  Labs (all labs ordered are listed, but only abnormal results are displayed) Labs Reviewed - No data to display  EKG   Radiology No  results found.  Procedures Procedures (including critical care time)  Medications Ordered in UC Medications - No data to display  Initial Impression / Assessment and Plan / UC Course  I have reviewed the triage vital signs  and the nursing notes.  Pertinent labs & imaging results that were available during my care of the patient were reviewed by me and considered in my medical decision making (see chart for details).     1.  Nonallergic rhinitis: Fluticasone nasal spray Humidifier use recommended Return precautions given  2.  Increase sputum production: This is likely secondary to recent cessation of smoking Mucinex twice daily to help with thick secretions I anticipate that the sputum will improve over the coming weeks.    Final diagnoses:  Nonallergic rhinitis  Viral bronchitis     Discharge Instructions      Congratulations on quitting smoking Please use the nasal spray once daily, that would help with nasal congestion Mucinex will help with chest congestion You may take ibuprofen or Tylenol as needed for pain and/or fever No indication for chest x-ray at this time Please return to urgent care if you have worsening symptoms.   ED Prescriptions     Medication Sig Dispense Auth. Provider   fluticasone (FLONASE) 50 MCG/ACT nasal spray Place 1 spray into both nostrils daily. 16 g Merrilee Jansky, MD   guaiFENesin (MUCINEX) 600 MG 12 hr tablet Take 1 tablet (600 mg total) by mouth 2 (two) times daily for 10 days. 20 tablet Clotiel Troop, Britta Mccreedy, MD      PDMP not reviewed this encounter.   Merrilee Jansky, MD 04/22/22 1031

## 2022-04-22 NOTE — Discharge Instructions (Addendum)
Congratulations on quitting smoking Please use the nasal spray once daily, that would help with nasal congestion Mucinex will help with chest congestion You may take ibuprofen or Tylenol as needed for pain and/or fever No indication for chest x-ray at this time Please return to urgent care if you have worsening symptoms.

## 2022-04-22 NOTE — ED Triage Notes (Signed)
Pt presents with c/o sore throat, ear aches and nasal congestion X 1 week. States his ear started aching 2 days ago. Pt staes he is spitting up thick yellow mucus.   Home interventions:Advil, no relief.

## 2022-05-03 ENCOUNTER — Ambulatory Visit
Admission: EM | Admit: 2022-05-03 | Discharge: 2022-05-03 | Disposition: A | Discharge status: Home / Self Care | Payer: Medicare Other | Attending: Urgent Care | Admitting: Urgent Care

## 2022-05-03 ENCOUNTER — Ambulatory Visit (INDEPENDENT_AMBULATORY_CARE_PROVIDER_SITE_OTHER): Payer: Medicare Other

## 2022-05-03 DIAGNOSIS — Z720 Tobacco use: Secondary | ICD-10-CM | POA: Diagnosis not present

## 2022-05-03 DIAGNOSIS — J40 Bronchitis, not specified as acute or chronic: Secondary | ICD-10-CM | POA: Diagnosis not present

## 2022-05-03 DIAGNOSIS — J329 Chronic sinusitis, unspecified: Secondary | ICD-10-CM

## 2022-05-03 DIAGNOSIS — J4530 Mild persistent asthma, uncomplicated: Secondary | ICD-10-CM | POA: Diagnosis not present

## 2022-05-03 MED ORDER — CETIRIZINE HCL 10 MG PO TABS: 10.0000 mg | ORAL_TABLET | Freq: Every day | ORAL | 0 refills | Status: AC

## 2022-05-03 MED ORDER — ALBUTEROL SULFATE HFA 108 (90 BASE) MCG/ACT IN AERS: 1.0000 | INHALATION_SPRAY | Freq: Four times a day (QID) | RESPIRATORY_TRACT | 0 refills | Status: AC | PRN

## 2022-05-03 MED ORDER — PROMETHAZINE-DM 6.25-15 MG/5ML PO SYRP: 5.0000 mL | ORAL_SOLUTION | Freq: Three times a day (TID) | ORAL | 0 refills | Status: AC | PRN

## 2022-05-03 MED ORDER — AMOXICILLIN-POT CLAVULANATE 875-125 MG PO TABS: 1.0000 | ORAL_TABLET | Freq: Two times a day (BID) | ORAL | 0 refills | Status: AC

## 2022-05-03 MED ORDER — PREDNISONE 20 MG PO TABS: ORAL_TABLET | ORAL | 0 refills | Status: AC

## 2022-05-03 NOTE — ED Triage Notes (Signed)
Pt c/o clear phlegm cough, body aches, head/chest congestion-states was seen here for the same 4/20-states he feels no better-NAD-steady gait

## 2022-05-03 NOTE — Discharge Instructions (Addendum)
Please start amoxicillin/clavulanate to address your sinus infection.  Use the oral prednisone course to address your asthma and bronchitis.  I refilled your albuterol inhaler.  You can use this daily as needed.  You can also take Zyrtec daily for your allergies.  Use the cough syrup as needed.  Establish care with [facility] as a primary care provider so that you can get your general screening done.

## 2022-05-03 NOTE — ED Provider Notes (Signed)
[facility]  Note:  This document was prepared using Conservation officer, historic buildings and may include unintentional dictation errors.  MRN: [id number] DOB: [birth year]  Subjective:   Randy Molina is a 32 y.o. male presenting for 3-week history of persistent sinus congestion, sinus drainage, coughing, chest congestion.  Reports that the cough has caused some chest pain.  Was seen 04/22/2022, was advised supportive care for allergic rhinitis.  Underwent a course of azithromycin in March 2024.  Patient has a history of asthma, was smoking before he switched to vaping in an effort to quit.  He is still working on quitting his vaping.  No current facility-administered medications for this encounter.  Current Outpatient Medications:    albuterol (VENTOLIN HFA) 108 (90 Base) MCG/ACT inhaler, Inhale 1-2 puffs into the lungs every 6 (six) hours as needed for wheezing or shortness of breath., Disp: 1 each, Rfl: 0   azithromycin (ZITHROMAX) 250 MG tablet, Take 1 tablet (250 mg total) by mouth daily. Take first 2 tablets together, then 1 every day until finished., Disp: 6 tablet, Rfl: 0   cyclobenzaprine (FLEXERIL) 10 MG tablet, Take 1 tablet (10 mg total) by mouth 2 (two) times daily as needed for muscle spasms., Disp: 20 tablet, Rfl: 0   fluticasone (FLONASE) 50 MCG/ACT nasal spray, Place 1 spray into both nostrils daily., Disp: 16 g, Rfl: 0   methylPREDNISolone (MEDROL DOSEPAK) 4 MG TBPK tablet, Follow package insert, Disp: 21 each, Rfl: 0   predniSONE (DELTASONE) 20 MG tablet, Take 3 tablets (60 mg total) by mouth daily., Disp: 15 tablet, Rfl: 0   promethazine-dextromethorphan (PROMETHAZINE-DM) 6.25-15 MG/5ML syrup, Take 5 mLs by mouth 4 (four) times daily as needed for cough., Disp: 118 mL, Rfl: 0   No Known Allergies  Past Medical History:  Diagnosis Date   Asthma    Not currently on medications; pending establishment of primary care   Current smoker    1.5 to 2  pack/day     Past Surgical History:  Procedure Laterality Date   Cardiac MRI Bilateral 04/11/2021   LVEF 57%.  No RWMA.  Normal BiV size.  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 => Increased trabeculations is a benign finding that could lead to EKG abnormality.   Graduated Exercise Tolerance Test  02/08/2021   Excellent exercise capacity-12.5 METS- only reached 77% MPHR. - No chest pain. ==> At max tolerated exercise, no evidence of ischemia would be relatively consistent with no myocardial ischemia despite not reaching 85% MPHR.   None     TRANSTHORACIC ECHOCARDIOGRAM  02/08/2021   Increased trabeculation from mid to apical portion of the left ventricle (does not meet ratio of >2:1 to be called noncompaction)-recommend cardiac MRI.  EF estimated 60 to 65%.  Normal RV normal valves.    Family History  Problem Relation Age of Onset   Lung cancer Mother 14   Cancer Father 48       Started off and jaw, recurred after resection    Social History   Tobacco Use   Smoking status: Former    Types: Cigarettes   Smokeless tobacco: Never  Vaping Use   Vaping Use: Every day  Substance Use Topics   Alcohol use: Yes    Comment: occ   Drug use: Never    ROS   Objective:   Vitals: BP 109/75 (BP Location: Right Arm)   Pulse 95   Temp 99.5 F (37.5 C) (Oral)  Resp 20   SpO2 97%   Physical Exam Constitutional:      General: He is not in acute distress.    Appearance: Normal appearance. He is well-developed. He is not ill-appearing, toxic-appearing or diaphoretic.  HENT:     Head: Normocephalic and atraumatic.     Right Ear: Tympanic membrane, ear canal and external ear normal. There is no impacted cerumen.     Left Ear: Tympanic membrane, ear canal and external ear normal. There is no impacted cerumen.     Nose: Congestion and rhinorrhea present.     Mouth/Throat:     Mouth: Mucous membranes are moist.     Pharynx:  No oropharyngeal exudate or posterior oropharyngeal erythema.  Eyes:     General: No scleral icterus.       Right eye: No discharge.        Left eye: No discharge.     Extraocular Movements: Extraocular movements intact.     Conjunctiva/sclera: Conjunctivae normal.  Cardiovascular:     Rate and Rhythm: Normal rate and regular rhythm.     Heart sounds: Normal heart sounds. No murmur heard.    No friction rub. No gallop.  Pulmonary:     Effort: Pulmonary effort is normal. No respiratory distress.     Breath sounds: Normal breath sounds. No stridor. No wheezing, rhonchi or rales.  Neurological:     Mental Status: He is alert and oriented to person, place, and time.  Psychiatric:        Mood and Affect: Mood normal.        Behavior: Behavior normal.        Thought Content: Thought content normal.    DG Chest 2 View  Result Date: 05/03/2022 CLINICAL DATA:  Persistent cough. EXAM: CHEST - 2 VIEW COMPARISON:  Chest radiograph dated 03/07/2022. FINDINGS: The heart size and mediastinal contours are within normal limits. Both lungs are clear. The visualized skeletal structures are unremarkable. IMPRESSION: No active cardiopulmonary disease. Electronically Signed   By: Elgie Collard M.D.   On: 05/03/2022 18:49     Assessment and Plan :   PDMP not reviewed this encounter.  1. Sinobronchitis   2. Vapes nicotine containing substance   3. Mild persistent asthma, uncomplicated    Recommend addressing his sinobronchitis with Augmentin, prednisone.  Start Zyrtec daily.  Refilled his albuterol inhaler.  Use supportive care otherwise.  Counseled patient on potential for adverse effects with medications prescribed/recommended today, ER and return-to-clinic precautions discussed, patient verbalized understanding.    Wallis Bamberg, [place] 05/03/22 0981

## 2022-08-08 ENCOUNTER — Ambulatory Visit: Payer: Medicare Other

## 2022-08-08 ENCOUNTER — Encounter: Payer: Self-pay | Admitting: Emergency Medicine

## 2022-08-08 ENCOUNTER — Ambulatory Visit
Admission: EM | Admit: 2022-08-08 | Discharge: 2022-08-08 | Disposition: A | Discharge status: Home / Self Care | Payer: Medicare Other | Attending: Physician Assistant | Admitting: Physician Assistant

## 2022-08-08 ENCOUNTER — Other Ambulatory Visit: Payer: Self-pay

## 2022-08-08 DIAGNOSIS — B349 Viral infection, unspecified: Secondary | ICD-10-CM | POA: Diagnosis not present

## 2022-08-08 MED ORDER — DOXYCYCLINE HYCLATE 100 MG PO CAPS: 100.0000 mg | ORAL_CAPSULE | Freq: Two times a day (BID) | ORAL | 0 refills | Status: DC

## 2022-08-08 MED ORDER — CEFTRIAXONE SODIUM 500 MG IJ SOLR: 500.0000 mg | Freq: Once | INTRAMUSCULAR | Status: DC

## 2022-08-08 NOTE — ED Provider Notes (Signed)
[facility]    CSN: [id number] Arrival date & time: 08/08/22  1035      History   Chief Complaint Chief Complaint  Patient presents with   Generalized Body Aches   Headache    HPI Randy Molina is a 32 y.o. male.   Patient complains of a headache and congestion for the past week.  Patient complains of pain in right ribs.  Patient reports recent travel and exposure to someone who has similar symptoms.  The history is provided by the patient. No language interpreter was used.  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  No RWMA.  Normal BiV size.  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 => Increased trabeculations is a benign finding that could lead to EKG abnormality.   Graduated Exercise Tolerance Test  02/08/2021   Excellent exercise capacity-12.5 METS- only reached 77% MPHR. - No chest pain. ==> At max tolerated exercise, no evidence of ischemia would be relatively consistent with no myocardial ischemia despite not reaching 85% MPHR.   None     TRANSTHORACIC ECHOCARDIOGRAM  02/08/2021   Increased trabeculation from mid  to apical portion of the left ventricle (does not meet ratio of >2:1 to be called noncompaction)-recommend cardiac MRI.  EF estimated 60 to 65%.  Normal RV normal valves.       Home Medications    Prior to Admission medications   Medication Sig Start Date End Date Taking? Authorizing Provider  doxycycline (VIBRAMYCIN) 100 MG capsule Take 1 capsule (100 mg total) by mouth 2 (two) times daily. 08/08/22  Yes Cheron Schaumann K, PA-C  albuterol (VENTOLIN HFA) 108 (90 Base) MCG/ACT inhaler Inhale 1-2 puffs into the lungs every 6 (six) hours as needed for wheezing or shortness of breath. 05/03/22   Wallis Bamberg, PA-C  amoxicillin-clavulanate (AUGMENTIN) 875-125 MG tablet Take 1 tablet by mouth 2 (two) times daily. 05/03/22   Wallis Bamberg, PA-C  cetirizine (ZYRTEC ALLERGY) 10 MG tablet Take 1 tablet (10 mg total) by mouth daily. 05/03/22   Wallis Bamberg, PA-C  predniSONE (DELTASONE) 20 MG tablet Take 2 tablets daily with breakfast. 05/03/22   Wallis Bamberg, PA-C  promethazine-dextromethorphan (PROMETHAZINE-DM) 6.25-15 MG/5ML syrup Take 5 mLs by mouth 3 (three) times daily as needed for cough. 05/03/22   Wallis Bamberg, PA-C    Family History Family History  Problem Relation Age of Onset   Lung cancer Mother 47   Cancer Father 60       Started off and jaw, recurred after resection    Social History Social History   Tobacco Use   Smoking status: Former    Types: Cigarettes   Smokeless tobacco: Never  Vaping Use   Vaping status: Every Day  Substance Use Topics   Alcohol use: Yes    Comment: occ   Drug use: Never     Allergies   Patient has no known allergies.   Review of Systems Review of Systems  Respiratory:  Positive for cough.   Neurological:  Positive for headaches.  All other systems reviewed and are negative.    Physical Exam Triage Vital Signs ED Triage Vitals  Encounter Vitals Group     BP 08/08/22 1049 107/76     Systolic BP Percentile --      Diastolic BP Percentile --       Pulse Rate 08/08/22 1049 75     Resp 08/08/22 1049 18     Temp 08/08/22 1049 98.2 F (36.8 C)     Temp Source 08/08/22 1049 Oral     SpO2 08/08/22 1049 98 %     Weight --      Height --      Head Circumference --      Peak Flow --      Pain Score 08/08/22 1050 5     Pain Loc --      Pain Education --      Exclude from Growth Chart --    No data found.  Updated Vital Signs BP 107/76 (BP Location: Left Arm)   Pulse 75   Temp 98.2 F (36.8 C) (Oral)   Resp 18   SpO2 98%   Visual Acuity Right Eye Distance:   Left Eye Distance:   Bilateral Distance:    Right Eye Near:   Left Eye Near:    Bilateral Near:     Physical Exam Vitals and nursing note reviewed.  Constitutional:      Appearance: He is well-developed.  HENT:     Head: Normocephalic.  Cardiovascular:     Rate and Rhythm: Normal rate.  Pulmonary:     Effort: Pulmonary effort is normal.     Comments: Under right ribs to palpation Abdominal:     General: There is no distension.  Musculoskeletal:        General: Normal range of motion.     Cervical back: Normal range of motion.  Neurological:     Mental Status: He is alert and oriented to person, place, and time.      UC Treatments / Results  Labs (all labs ordered are listed, but only abnormal results are displayed) Labs Reviewed - No data to display  EKG   Radiology DG Chest 2 View  Result Date: 08/08/2022 CLINICAL DATA:  Right chest pain EXAM: CHEST - 2 VIEW COMPARISON:  Chest radiograph 05/03/2022 FINDINGS: The cardiomediastinal silhouette is normal There is no focal consolidation or pulmonary edema. There is no pleural effusion or pneumothorax There is no acute osseous abnormality. IMPRESSION: No radiographic evidence of acute cardiopulmonary process. Electronically Signed   By: Lesia Hausen M.D.   On: 08/08/2022 11:49    Procedures Procedures (including critical care time)  Medications Ordered in UC Medications - No data to  display  Initial Impression / Assessment and Plan / UC Course  I have reviewed the triage vital signs and the nursing notes.  Pertinent labs & imaging results that were available during my care of the patient were reviewed by me and considered in my medical decision making (see chart for details).      Final Clinical Impressions(s) / UC Diagnoses   Final diagnoses:  Urethritis  STD exposure     Discharge Instructions      Return if any problems.     ED Prescriptions     Medication Sig Dispense Auth. Provider   doxycycline (VIBRAMYCIN) 100 MG capsule  (Status: Discontinued) Take 1 capsule (100 mg total) by mouth 2 (two) times daily. 20 capsule Elson Areas, [place]      PDMP not reviewed this encounter. An After Visit Summary was printed and given to the patient.    Elson Areas, [place] 08/08/22 1201

## 2022-08-08 NOTE — ED Triage Notes (Signed)
Patient presents to [facility] for evaluation of 1 week of nasal congestion, sore throat, cough, body aches, and headaches.  Additionally is concerned for a "pain" in his RUQ.

## 2022-08-08 NOTE — Discharge Instructions (Addendum)
Return if any problems.  Tylenol for discomfort.  Over-the-counter cough medicine for cough follow-up with your physician for recheck.

## 2022-08-20 ENCOUNTER — Other Ambulatory Visit: Payer: Self-pay

## 2022-08-20 ENCOUNTER — Encounter (HOSPITAL_COMMUNITY): Payer: Self-pay

## 2022-08-20 ENCOUNTER — Emergency Department (HOSPITAL_COMMUNITY)
Admission: EM | Admit: 2022-08-20 | Discharge: 2022-08-21 | Disposition: A | Discharge status: Home / Self Care | Payer: Medicare Other | Attending: Emergency Medicine | Admitting: Emergency Medicine

## 2022-08-20 DIAGNOSIS — T6594XA Toxic effect of unspecified substance, undetermined, initial encounter: Secondary | ICD-10-CM | POA: Insufficient documentation

## 2022-08-20 DIAGNOSIS — R4182 Altered mental status, unspecified: Secondary | ICD-10-CM | POA: Insufficient documentation

## 2022-08-20 DIAGNOSIS — R000 Tachycardia, unspecified: Secondary | ICD-10-CM | POA: Diagnosis not present

## 2022-08-20 LAB — COMPREHENSIVE METABOLIC PANEL
ALT: 22 U/L (ref 0–44)
AST: 29 U/L (ref 15–41)
Albumin: 4 g/dL (ref 3.5–5.0)
Alkaline Phosphatase: 55 U/L (ref 38–126)
Anion gap: 20 — ABNORMAL HIGH (ref 5–15)
BUN: 31 mg/dL — ABNORMAL HIGH (ref 6–20)
CO2: 17 mmol/L — ABNORMAL LOW (ref 22–32)
Calcium: 9.1 mg/dL (ref 8.9–10.3)
Chloride: 102 mmol/L (ref 98–111)
Creatinine, Ser: 2.06 mg/dL — ABNORMAL HIGH (ref 0.61–1.24)
GFR, Estimated: 43 mL/min — ABNORMAL LOW (ref >60)
Glucose, Bld: 256 mg/dL — ABNORMAL HIGH (ref 70–99)
Potassium: 4.1 mmol/L (ref 3.5–5.1)
Sodium: 139 mmol/L (ref 135–145)
Total Bilirubin: 0.7 mg/dL (ref 0.3–1.2)
Total Protein: 7 g/dL (ref 6.5–8.1)

## 2022-08-20 LAB — CBC WITH DIFFERENTIAL/PLATELET
Abs Immature Granulocytes: 0.09 10*3/uL — ABNORMAL HIGH (ref 0.00–0.07)
Basophils Absolute: 0 10*3/uL (ref 0.0–0.1)
Basophils Relative: 0 %
Eosinophils Absolute: 0 10*3/uL (ref 0.0–0.5)
Eosinophils Relative: 0 %
HCT: 38.3 % — ABNORMAL LOW (ref 39.0–52.0)
Hemoglobin: 12.6 g/dL — ABNORMAL LOW (ref 13.0–17.0)
Immature Granulocytes: 1 %
Lymphocytes Relative: 9 %
Lymphs Abs: 1.4 10*3/uL (ref 0.7–4.0)
MCH: 30.5 pg (ref 26.0–34.0)
MCHC: 32.9 g/dL (ref 30.0–36.0)
MCV: 92.7 fL (ref 80.0–100.0)
Monocytes Absolute: 0.7 10*3/uL (ref 0.1–1.0)
Monocytes Relative: 4 %
Neutro Abs: 13.2 10*3/uL — ABNORMAL HIGH (ref 1.7–7.7)
Neutrophils Relative %: 86 %
Platelets: 203 10*3/uL (ref 150–400)
RBC: 4.13 MIL/uL — ABNORMAL LOW (ref 4.22–5.81)
RDW: 12.2 % (ref 11.5–15.5)
WBC: 15.3 10*3/uL — ABNORMAL HIGH (ref 4.0–10.5)
nRBC: 0 % (ref 0.0–0.2)

## 2022-08-20 LAB — ACETAMINOPHEN LEVEL: Acetaminophen (Tylenol), Serum: <10 ug/mL — ABNORMAL LOW (ref 10–30)

## 2022-08-20 LAB — ETHANOL: Alcohol, Ethyl (B): <10 mg/dL (ref <10)

## 2022-08-20 LAB — SALICYLATE LEVEL: Salicylate Lvl: <7 mg/dL — ABNORMAL LOW (ref 7.0–30.0)

## 2022-08-20 MED ORDER — SODIUM CHLORIDE 0.9 % IV BOLUS
1000.0000 mL | Freq: Once | INTRAVENOUS | Status: AC
  Administered 2022-08-21: 1000 mL via INTRAVENOUS

## 2022-08-20 MED ORDER — SODIUM CHLORIDE 0.9 % IV BOLUS
1000.0000 mL | Freq: Once | INTRAVENOUS | Status: AC
  Administered 2022-08-20: 1000 mL via INTRAVENOUS
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
## 2022-08-21 ENCOUNTER — Emergency Department (HOSPITAL_COMMUNITY): Payer: Medicare Other

## 2022-08-21 LAB — COMPREHENSIVE METABOLIC PANEL
ALT: 21 U/L (ref 0–44)
AST: 27 U/L (ref 15–41)
Albumin: 3.8 g/dL (ref 3.5–5.0)
Alkaline Phosphatase: 55 U/L (ref 38–126)
Anion gap: 12 (ref 5–15)
BUN: 26 mg/dL — ABNORMAL HIGH (ref 6–20)
CO2: 20 mmol/L — ABNORMAL LOW (ref 22–32)
Calcium: 8.4 mg/dL — ABNORMAL LOW (ref 8.9–10.3)
Chloride: 106 mmol/L (ref 98–111)
Creatinine, Ser: 1.95 mg/dL — ABNORMAL HIGH (ref 0.61–1.24)
GFR, Estimated: 46 mL/min — ABNORMAL LOW (ref >60)
Glucose, Bld: 95 mg/dL (ref 70–99)
Potassium: 3.9 mmol/L (ref 3.5–5.1)
Sodium: 138 mmol/L (ref 135–145)
Total Bilirubin: 0.5 mg/dL (ref 0.3–1.2)
Total Protein: 6.7 g/dL (ref 6.5–8.1)

## 2022-08-21 LAB — RAPID URINE DRUG SCREEN, HOSP PERFORMED
Amphetamines: NOT DETECTED
Barbiturates: NOT DETECTED
Benzodiazepines: NOT DETECTED
Cocaine: POSITIVE — AB
Opiates: NOT DETECTED
Tetrahydrocannabinol: NOT DETECTED

## 2022-08-21 LAB — CK: Total CK: 929 U/L — ABNORMAL HIGH (ref 49–397)

## 2022-08-21 NOTE — Discharge Instructions (Signed)
You were found to have cocaine in your system today. Your kidney numbers were also elevated, we gave you fluids but please continue drinking lots of water at home. Follow-up with doctor to have your labs re-checked in about a week or two. Can see orthopedics if ongoing issues with your ankle. Return here for new concerns.

## 2023-01-20 IMAGING — MR MR CARD MORPHOLOGY WO/W CM
45 of 48 series · 45 of 48 positions shown · IV contrast (Contrast agent)
Comparison: none

CLINICAL DATA: Structural (nonvalvular) heart disease, follow up
abnormal echo-concern for possible noncompaction

EXAM:
CARDIAC MRI
TECHNIQUE: The patient was scanned on a 1.5 Tesla GE magnet. A dedicated
cardiac coil was used. Functional imaging was done using Fiesta
sequences. [DATE], and 4 chamber views were done to assess for RWMA's.
Modified Jeansly rule using a short axis stack was used to
calculate an ejection fraction on a dedicated work station using
Circle software. The patient received 8mL GADAVIST GADOBUTROL 1
MMOL/ML IV SOLN. After 10 minutes inversion recovery sequences were
used to assess for infiltration and scar tissue.

[Series 4: [id number] · axial · 8.0mm · 1.41mm/px · 1 of 16 slices shown]
[im 1/16]
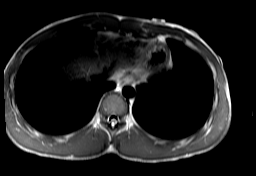

[Series 11: bSSFP · oblique · 8.0mm · 1.61mm/px · 1 of 25 slices shown (1 of 23)]
[im 1/25]
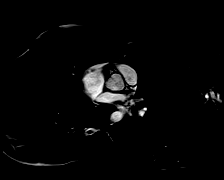

[Series 12: bSSFP · oblique · 8.0mm · 1.61mm/px · 1 of 25 slices shown (2 of 23)]
[im 1/25]
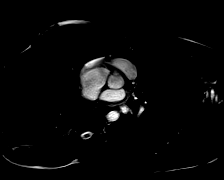

[Series 13: bSSFP · oblique · 8.0mm · 1.61mm/px · 1 of 25 slices shown (3 of 23)]
[im 1/25]
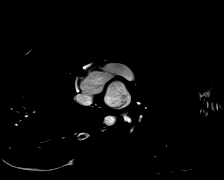

[Series 14: bSSFP · oblique · 8.0mm · 1.61mm/px · 1 of 25 slices shown (4 of 23)]
[im 1/25]
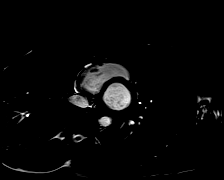

[Series 15: bSSFP · oblique · 8.0mm · 1.61mm/px · 1 of 25 slices shown (5 of 23)]
[im 1/25]
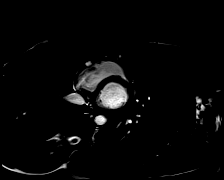

[Series 16: bSSFP · oblique · 8.0mm · 1.61mm/px · 1 of 25 slices shown (6 of 23)]
[im 1/25]
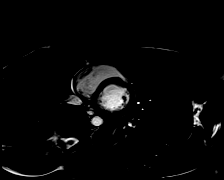

[Series 17: bSSFP · oblique · 8.0mm · 1.61mm/px · 1 of 25 slices shown (7 of 23)]
[im 1/25]
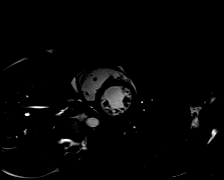

[Series 18: bSSFP · oblique · 8.0mm · 1.61mm/px · 1 of 25 slices shown (8 of 23)]
[im 1/25]
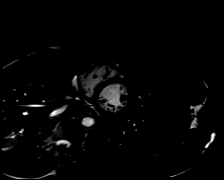

[Series 19: bSSFP · oblique · 8.0mm · 1.61mm/px · 1 of 25 slices shown (9 of 23)]
[im 1/25]
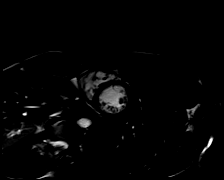

[Series 20: bSSFP · oblique · 8.0mm · 1.61mm/px · 1 of 25 slices shown (10 of 23)]
[im 1/25]
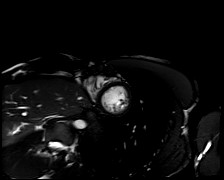

[Series 21: bSSFP · oblique · 8.0mm · 1.61mm/px · 1 of 25 slices shown (11 of 23)]
[im 1/25]
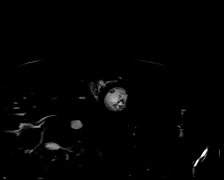

[Series 22: bSSFP · oblique · 8.0mm · 1.61mm/px · 1 of 25 slices shown (12 of 23)]
[im 1/25]
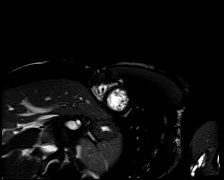

[Series 23: bSSFP · oblique · 8.0mm · 1.61mm/px · 1 of 25 slices shown (13 of 23)]
[im 1/25]
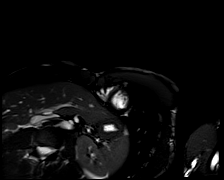

[Series 24: bSSFP · oblique · 8.0mm · 1.61mm/px · 1 of 25 slices shown (14 of 23)]
[im 1/25]
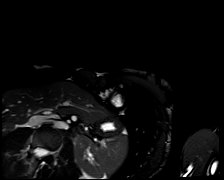

[Series 25: bSSFP · oblique · 8.0mm · 1.61mm/px · 1 of 25 slices shown (15 of 23)]
[im 1/25]
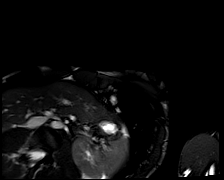

[Series 26: bSSFP · oblique · 8.0mm · 1.61mm/px · 1 of 25 slices shown (16 of 23)]
[im 1/25]
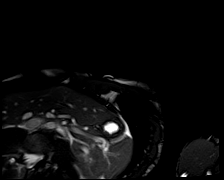

[Series 27: bSSFP · oblique · 8.0mm · 1.61mm/px · 1 of 25 slices shown (17 of 23)]
[im 1/25]
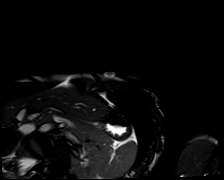

[Series 28: bSSFP · oblique · 8.0mm · 1.61mm/px · 1 of 25 slices shown (18 of 23)]
[im 1/25]
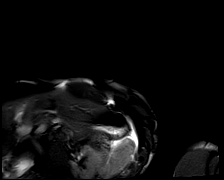

[Series 29: bSSFP · sagittal · 6.0mm · 1.41mm/px · 1 of 25 slices shown (19 of 23)]
[im 1/25]
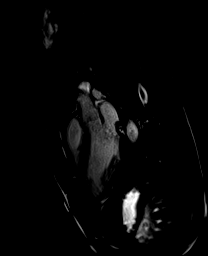

[Series 30: bSSFP · oblique · 6.0mm · 1.41mm/px · 1 of 25 slices shown (20 of 23)]
[im 1/25]
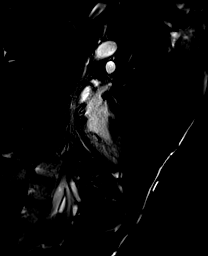

[Series 31: bSSFP · oblique · 6.0mm · 1.41mm/px · 1 of 25 slices shown (21 of 23)]
[im 1/25]
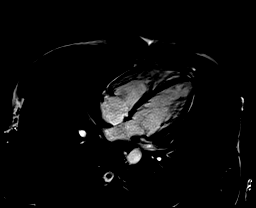

[Series 32: bSSFP · oblique · 6.0mm · 1.41mm/px · 1 of 25 slices shown (22 of 23)]
[im 1/25]
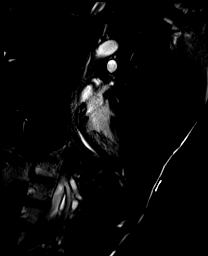

[Series 33: bSSFP · oblique · 6.0mm · 1.41mm/px · 1 of 25 slices shown (23 of 23)]
[im 1/25]
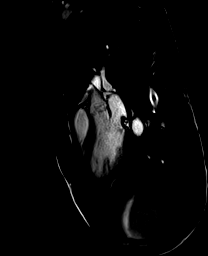

[Series 34: cine_trufi_short axis_cs_2_shot · oblique · 8.0mm · 1.48mm/px · 1 of 25 slices shown (1 of 21)]
[im 1/25]
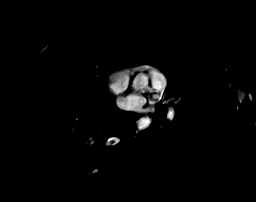

[Series 34: cine_trufi_short axis_cs_2_shot · oblique · 8.0mm · 1.48mm/px · 1 of 25 slices shown (2 of 21)]
[im 1/25]
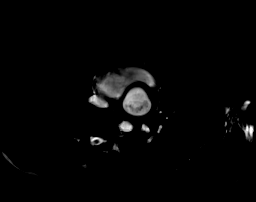

[Series 34: cine_trufi_short axis_cs_2_shot · oblique · 8.0mm · 1.48mm/px · 1 of 25 slices shown (3 of 21)]
[im 1/25]
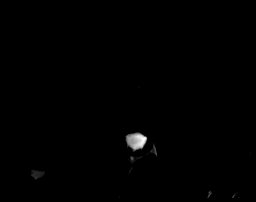

[Series 34: cine_trufi_short axis_cs_2_shot · oblique · 8.0mm · 1.48mm/px · 1 of 25 slices shown (4 of 21)]
[im 1/25]
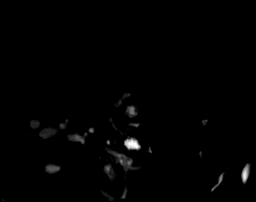

[Series 34: cine_trufi_short axis_cs_2_shot · oblique · 8.0mm · 1.48mm/px · 1 of 25 slices shown (5 of 21)]
[im 1/25]
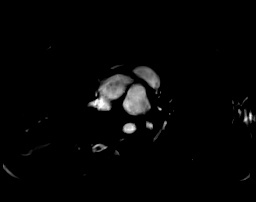

[Series 34: cine_trufi_short axis_cs_2_shot · oblique · 8.0mm · 1.48mm/px · 1 of 25 slices shown (6 of 21)]
[im 1/25]
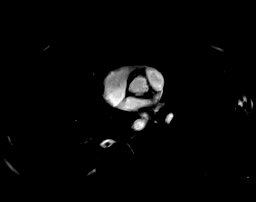

[Series 34: cine_trufi_short axis_cs_2_shot · oblique · 8.0mm · 1.48mm/px · 1 of 25 slices shown (7 of 21)]
[im 1/25]
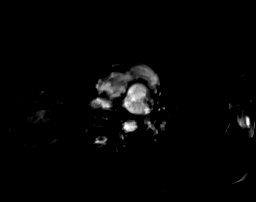

[Series 34: cine_trufi_short axis_cs_2_shot · oblique · 8.0mm · 1.48mm/px · 1 of 25 slices shown (8 of 21)]
[im 1/25]
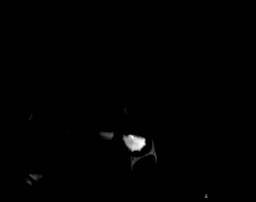

[Series 34: cine_trufi_short axis_cs_2_shot · oblique · 8.0mm · 1.48mm/px · 1 of 25 slices shown (9 of 21)]
[im 1/25]
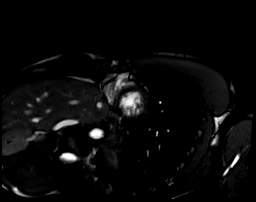

[Series 34: cine_trufi_short axis_cs_2_shot · oblique · 8.0mm · 1.48mm/px · 1 of 25 slices shown (10 of 21)]
[im 1/25]
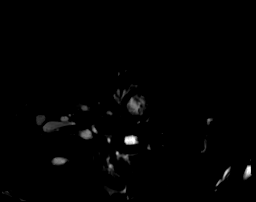

[Series 34: cine_trufi_short axis_cs_2_shot · oblique · 8.0mm · 1.48mm/px · 1 of 25 slices shown (11 of 21)]
[im 1/25]
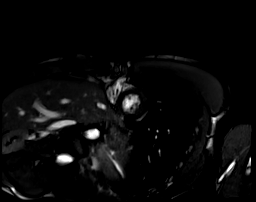

[Series 34: cine_trufi_short axis_cs_2_shot · oblique · 8.0mm · 1.48mm/px · 1 of 25 slices shown (12 of 21)]
[im 1/25]
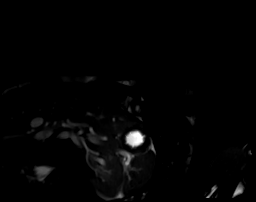

[Series 34: cine_trufi_short axis_cs_2_shot · oblique · 8.0mm · 1.48mm/px · 1 of 25 slices shown (13 of 21)]
[im 1/25]
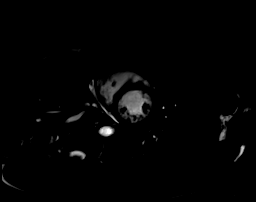

[Series 34: cine_trufi_short axis_cs_2_shot · oblique · 8.0mm · 1.48mm/px · 1 of 25 slices shown (14 of 21)]
[im 1/25]
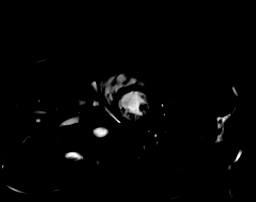

[Series 34: cine_trufi_short axis_cs_2_shot · oblique · 8.0mm · 1.48mm/px · 1 of 25 slices shown (15 of 21)]
[im 1/25]
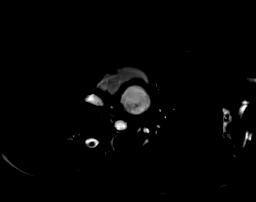

[Series 34: cine_trufi_short axis_cs_2_shot · oblique · 8.0mm · 1.48mm/px · 1 of 25 slices shown (16 of 21)]
[im 1/25]
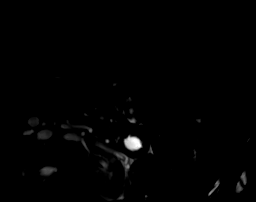

[Series 34: cine_trufi_short axis_cs_2_shot · oblique · 8.0mm · 1.48mm/px · 1 of 25 slices shown (17 of 21)]
[im 1/25]
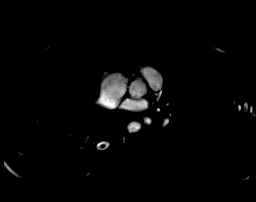

[Series 34: cine_trufi_short axis_cs_2_shot · oblique · 8.0mm · 1.48mm/px · 1 of 25 slices shown (18 of 21)]
[im 1/25]
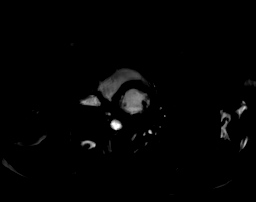

[Series 35: cine_trufi_short axis_cs_2_shot · oblique · 8.0mm · 1.48mm/px · 1 of 25 slices shown (19 of 21)]
[im 1/25]
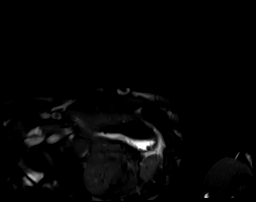

[Series 35: cine_trufi_short axis_cs_2_shot · oblique · 8.0mm · 1.48mm/px · 1 of 25 slices shown (20 of 21)]
[im 1/25]
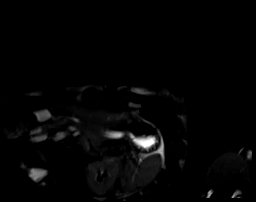

[Series 35: cine_trufi_short axis_cs_2_shot · oblique · 8.0mm · 1.48mm/px · 1 of 25 slices shown (21 of 21)]
[im 1/25]
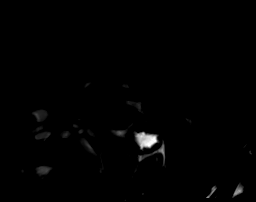

[45 of 48 positions shown; findings below may reference images not displayed]

This examination is tailored for evaluation cardiac anatomy and
function and provides very limited assessment of noncardiac
structures, which are accordingly not evaluated during
interpretation. If there is clinical concern for extracardiac
pathology, further evaluation with CT imaging should be considered.
FINDINGS: LEFT VENTRICLE:

Normal left ventricular chamber size.

Normal left ventricular wall thickness. Maximal wall thickness 8 mm.

Normal left ventricular systolic function.

LVEF = 57%

There are no regional wall motion abnormalities.

No myocardial edema, T2 = 48 msec

Normal first pass perfusion.

There is no post contrast delayed myocardial enhancement.

Normal T1 myocardial nulling kinetics suggest against a diagnosis of
cardiac amyloidosis.

ECV = 23%, normal range.

The ratio of noncompacted to compacted myocardial in the lateral
apex is [DATE], which does not meet diagnostic criteria for
noncompaction. Findings consistent with prominent lateral and apical
trabeculations.

RIGHT VENTRICLE:

Normal right ventricular chamber size.

Normal right ventricular wall thickness.

Normal right ventricular systolic function.

RVEF = 53%

There are no regional wall motion abnormalities.

No post contrast delayed myocardial enhancement.

ATRIA:

Normal left atrial size.

Normal right atrial size.

VALVES:

No significant valvular abnormalities.

Tricuspid aortic valve.

PERICARDIUM:

Normal pericardium.  Trace pericardial fluid.

OTHER: No significant extracardiac findings.

MEASUREMENTS:
Left ventricle:

LV male

LV EF: 57% (normal 56-78%)

Absolute volumes:

LV EDV: 148 mL (Normal 77-195 mL)

LV ESV: 63 mL (Normal 19-72 mL)

LV SV: 85 mL (Normal 51-133 mL)

CO: 4.9 L/min (Normal 2.8-8.8 L/min)

Indexed volumes:

LV EDV: 83 mL/sq-m (Normal 47-92 mL/sq-m)

LV ESV: 35 mL/sq-m (Normal 13-30 mL/sq-m)

LV SV: 47 mL/sq-m (Normal 32-62 mL/sq-m)

CI: 2.7 L/min/sq-m (Normal 1.7-4.2 L/min/sq-m)

Right ventricle:

RV male

RV EF: 53 % (Normal 47-74%)

Absolute volumes:

RV EDV: 153 mL (Normal 88-227 mL)

RV ESV: 72 mL (Normal 23-103 mL)

RV SV: 82 mL (Normal 52-138 mL)

CO: 4.7 L/min (Normal 2.8-8.8 L/min)

Indexed volumes:

RV EDV: 85 mL/sq-m (Normal 55-105 mL/sq-m)

RV ESV: 40 mL/sq-m (Normal 15-43 mL/sq-m)

RV SV: 46 mL/sq-m (Normal 32-64 mL/sq-m)

CI: 2.6 L/min/sq-m (Normal 1.7-4.2 L/min/sq-m)
IMPRESSION: 1. Normal biventricular chamber size and function. LVEF 57%, RVEF
53%.

2. No postcontrast delayed myocardial enhancement. ECV 23%. No
myocardial edema.

3. Ratio of noncompacted to compacted myocardium does not meet
criteria for noncompaction cardiomyopathy. Findings consistent with
prominent lateral wall and apical trabeculations.

## 2023-04-14 ENCOUNTER — Emergency Department (HOSPITAL_COMMUNITY)
Admission: EM | Admit: 2023-04-14 | Discharge: 2023-04-14 | Disposition: A | Discharge status: Home / Self Care | Payer: Self-pay | Attending: Emergency Medicine | Admitting: Emergency Medicine

## 2023-04-14 ENCOUNTER — Other Ambulatory Visit: Payer: Self-pay

## 2023-04-14 ENCOUNTER — Encounter (HOSPITAL_COMMUNITY): Payer: Self-pay | Admitting: Emergency Medicine

## 2023-04-14 DIAGNOSIS — F17200 Nicotine dependence, unspecified, uncomplicated: Secondary | ICD-10-CM | POA: Insufficient documentation

## 2023-04-14 DIAGNOSIS — F10120 Alcohol abuse with intoxication, uncomplicated: Secondary | ICD-10-CM | POA: Insufficient documentation

## 2023-04-14 DIAGNOSIS — Y900 Blood alcohol level of less than 20 mg/100 ml: Secondary | ICD-10-CM | POA: Insufficient documentation

## 2023-04-14 DIAGNOSIS — F10920 Alcohol use, unspecified with intoxication, uncomplicated: Secondary | ICD-10-CM

## 2023-04-14 DIAGNOSIS — Z7952 Long term (current) use of systemic steroids: Secondary | ICD-10-CM | POA: Insufficient documentation

## 2023-04-14 DIAGNOSIS — Z7951 Long term (current) use of inhaled steroids: Secondary | ICD-10-CM | POA: Insufficient documentation

## 2023-04-14 DIAGNOSIS — J45909 Unspecified asthma, uncomplicated: Secondary | ICD-10-CM | POA: Insufficient documentation

## 2023-04-14 LAB — COMPREHENSIVE METABOLIC PANEL WITH GFR
ALT: 21 U/L (ref 0–44)
AST: 35 U/L (ref 15–41)
Albumin: 4.4 g/dL (ref 3.5–5.0)
Alkaline Phosphatase: 58 U/L (ref 38–126)
Anion gap: 11 (ref 5–15)
BUN: 19 mg/dL (ref 6–20)
CO2: 22 mmol/L (ref 22–32)
Calcium: 9.3 mg/dL (ref 8.9–10.3)
Chloride: 105 mmol/L (ref 98–111)
Creatinine, Ser: 1.4 mg/dL — ABNORMAL HIGH (ref 0.61–1.24)
GFR, Estimated: >60 mL/min (ref >60)
Glucose, Bld: 182 mg/dL — ABNORMAL HIGH (ref 70–99)
Potassium: 3.8 mmol/L (ref 3.5–5.1)
Sodium: 138 mmol/L (ref 135–145)
Total Bilirubin: 0.6 mg/dL (ref 0.0–1.2)
Total Protein: 7.7 g/dL (ref 6.5–8.1)

## 2023-04-14 LAB — CBC
HCT: 40.8 % (ref 39.0–52.0)
Hemoglobin: 13.7 g/dL (ref 13.0–17.0)
MCH: 30.7 pg (ref 26.0–34.0)
MCHC: 33.6 g/dL (ref 30.0–36.0)
MCV: 91.5 fL (ref 80.0–100.0)
Platelets: 218 10*3/uL (ref 150–400)
RBC: 4.46 MIL/uL (ref 4.22–5.81)
RDW: 12.7 % (ref 11.5–15.5)
WBC: 11.8 10*3/uL — ABNORMAL HIGH (ref 4.0–10.5)
nRBC: 0 % (ref 0.0–0.2)

## 2023-04-14 LAB — ACETAMINOPHEN LEVEL: Acetaminophen (Tylenol), Serum: <10 ug/mL — ABNORMAL LOW (ref 10–30)

## 2023-04-14 LAB — ETHANOL: Alcohol, Ethyl (B): <10 mg/dL (ref <10)

## 2023-04-14 LAB — SALICYLATE LEVEL: Salicylate Lvl: <7 mg/dL — ABNORMAL LOW (ref 7.0–30.0)
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
## 2023-06-23 ENCOUNTER — Encounter (HOSPITAL_BASED_OUTPATIENT_CLINIC_OR_DEPARTMENT_OTHER): Payer: Self-pay

## 2023-06-23 ENCOUNTER — Emergency Department (HOSPITAL_BASED_OUTPATIENT_CLINIC_OR_DEPARTMENT_OTHER): Payer: Self-pay

## 2023-06-23 ENCOUNTER — Other Ambulatory Visit: Payer: Self-pay

## 2023-06-23 ENCOUNTER — Emergency Department (HOSPITAL_BASED_OUTPATIENT_CLINIC_OR_DEPARTMENT_OTHER)
Admission: EM | Admit: 2023-06-23 | Discharge: 2023-06-23 | Disposition: A | Discharge status: Home / Self Care | Payer: Self-pay

## 2023-06-23 DIAGNOSIS — R0789 Other chest pain: Secondary | ICD-10-CM | POA: Insufficient documentation

## 2023-06-23 DIAGNOSIS — R079 Chest pain, unspecified: Secondary | ICD-10-CM

## 2023-06-23 DIAGNOSIS — J45909 Unspecified asthma, uncomplicated: Secondary | ICD-10-CM | POA: Insufficient documentation

## 2023-06-23 LAB — CBC WITH DIFFERENTIAL/PLATELET
Abs Immature Granulocytes: 0.01 10*3/uL (ref 0.00–0.07)
Basophils Absolute: 0 10*3/uL (ref 0.0–0.1)
Basophils Relative: 1 %
Eosinophils Absolute: 0.1 10*3/uL (ref 0.0–0.5)
Eosinophils Relative: 2 %
HCT: 38.1 % — ABNORMAL LOW (ref 39.0–52.0)
Hemoglobin: 12.8 g/dL — ABNORMAL LOW (ref 13.0–17.0)
Immature Granulocytes: 0 %
Lymphocytes Relative: 48 %
Lymphs Abs: 2.9 10*3/uL (ref 0.7–4.0)
MCH: 31 pg (ref 26.0–34.0)
MCHC: 33.6 g/dL (ref 30.0–36.0)
MCV: 92.3 fL (ref 80.0–100.0)
Monocytes Absolute: 0.5 10*3/uL (ref 0.1–1.0)
Monocytes Relative: 8 %
Neutro Abs: 2.5 10*3/uL (ref 1.7–7.7)
Neutrophils Relative %: 41 %
Platelets: 160 10*3/uL (ref 150–400)
RBC: 4.13 MIL/uL — ABNORMAL LOW (ref 4.22–5.81)
RDW: 13 % (ref 11.5–15.5)
WBC: 6 10*3/uL (ref 4.0–10.5)
nRBC: 0 % (ref 0.0–0.2)

## 2023-06-23 LAB — COMPREHENSIVE METABOLIC PANEL WITH GFR
ALT: 36 U/L (ref 0–44)
AST: 41 U/L (ref 15–41)
Albumin: 4.3 g/dL (ref 3.5–5.0)
Alkaline Phosphatase: 66 U/L (ref 38–126)
Anion gap: 13 (ref 5–15)
BUN: 17 mg/dL (ref 6–20)
CO2: 21 mmol/L — ABNORMAL LOW (ref 22–32)
Calcium: 8.8 mg/dL — ABNORMAL LOW (ref 8.9–10.3)
Chloride: 107 mmol/L (ref 98–111)
Creatinine, Ser: 1.27 mg/dL — ABNORMAL HIGH (ref 0.61–1.24)
GFR, Estimated: >60 mL/min (ref >60)
Glucose, Bld: 91 mg/dL (ref 70–99)
Potassium: 3.8 mmol/L (ref 3.5–5.1)
Sodium: 141 mmol/L (ref 135–145)
Total Bilirubin: 0.7 mg/dL (ref 0.0–1.2)
Total Protein: 6.8 g/dL (ref 6.5–8.1)

## 2023-06-23 LAB — TROPONIN T, HIGH SENSITIVITY
Troponin T High Sensitivity: <15 ng/L (ref <19)
Troponin T High Sensitivity: <15 ng/L (ref <19)

## 2023-06-23 MED ORDER — NAPROXEN 500 MG PO TABS: 500.0000 mg | ORAL_TABLET | Freq: Two times a day (BID) | ORAL | 0 refills | Status: AC

## 2023-06-23 MED ORDER — KETOROLAC TROMETHAMINE 15 MG/ML IJ SOLN
15.0000 mg | Freq: Once | INTRAMUSCULAR | Status: AC
  Administered 2023-06-23: 15 mg via INTRAMUSCULAR
  Filled 2023-06-23: qty 1

## 2023-06-23 NOTE — ED Provider Notes (Signed)
 [place] EMERGENCY DEPARTMENT AT [facility] Provider Note   CSN: [id number] Arrival date & time: 06/23/23  2003     Patient presents with: Chest Pain   Randy Molina is a 33 y.o. male.   32 year old male with past medical history of asthma presents emergency department today with right-sided chest pain.  The patient states that he is having pain mostly over his right lower ribs.  States that this is a sharp pain.  He denies any pleuritic component to this.  Denies any history of DVT or pulmonary embolism, recent surgeries, recent travel.  He denies any significant leg swelling.  Denies any hemoptysis.  He came to the emergency department today for further evaluation regarding this due to ongoing symptoms.   Chest Pain      Prior to Admission medications   Medication Sig Start Date End Date Taking? Authorizing Provider  naproxen (NAPROSYN) 500 MG tablet Take 1 tablet (500 mg total) by mouth 2 (two) times daily. 06/23/23  Yes Ula Prentice SAUNDERS, MD  albuterol  (VENTOLIN  HFA) 108 [phone] Base) MCG/ACT inhaler Inhale 1-2 puffs into the lungs every 6 (six) hours as needed for wheezing or shortness of breath. 05/03/22   Christopher Savannah, PA-C  amoxicillin -clavulanate (AUGMENTIN ) 875-125 MG tablet Take 1 tablet by mouth 2 (two) times daily. 05/03/22   Christopher Savannah, PA-C  cetirizine  (ZYRTEC  ALLERGY) 10 MG tablet Take 1 tablet (10 mg total) by mouth daily. 05/03/22   Christopher Savannah, PA-C  predniSONE  (DELTASONE ) 20 MG tablet Take 2 tablets daily with breakfast. 05/03/22   Christopher Savannah, PA-C  promethazine -dextromethorphan (PROMETHAZINE -DM) 6.25-15 MG/5ML syrup Take 5 mLs by mouth 3 (three) times daily as needed for cough. 05/03/22   Christopher Savannah, PA-C    Allergies: Patient has no known allergies.    Review of Systems  Cardiovascular:  Positive for chest pain.  All other systems reviewed and are negative.   Updated Vital Signs BP 114/75   Pulse (!) 53   Temp 98.2 F (36.8 C) (Oral)   Resp 20   Wt  73.5 kg   SpO2 100%   BMI 25.37 kg/m   Physical Exam Vitals and nursing note reviewed.   Gen: NAD Eyes: PERRL, EOMI HEENT: no oropharyngeal swelling Neck: trachea midline Resp: clear to auscultation bilaterally Card: RRR, no murmurs, rubs, or gallops, the patient does have some reproducible tenderness over palpation over the lower ribs on the right side without any abdominal tenderness noted Abd: nontender, nondistended Extremities: no calf tenderness, no edema Vascular: 2+ radial pulses bilaterally, 2+ DP pulses bilaterally Neuro: No focal deficits Skin: no rashes Psyc: acting appropriately   (all labs ordered are listed, but only abnormal results are displayed) Labs Reviewed  COMPREHENSIVE METABOLIC PANEL WITH GFR - Abnormal; Notable for the following components:      Result Value   CO2 21 (*)    Creatinine, Ser 1.27 (*)    Calcium 8.8 (*)    All other components within normal limits  CBC WITH DIFFERENTIAL/PLATELET - Abnormal; Notable for the following components:   RBC 4.13 (*)    Hemoglobin 12.8 (*)    HCT 38.1 (*)    All other components within normal limits  TROPONIN T, HIGH SENSITIVITY  TROPONIN T, HIGH SENSITIVITY    EKG: EKG Interpretation Date/Time:  Saturday June 23 2023 20:10:23 EDT Ventricular Rate:  61 PR Interval:  141 QRS Duration:  90 QT Interval:  419 QTC Calculation: 422 R Axis:   77  Text  Interpretation: Sinus rhythm Nonspecific T abnormalities, anterior leads Confirmed by Ula Barter [phone]) on 06/23/2023 8:36:30 PM  Radiology: DG Chest Portable 1 View Result Date: 06/23/2023 CLINICAL DATA:  cp EXAM: PORTABLE CHEST - 1 VIEW COMPARISON:  08/08/2022. FINDINGS: Cardiac silhouette is unremarkable. No pneumothorax or pleural effusion. The lungs are clear. The visualized skeletal structures are unremarkable. IMPRESSION: No acute cardiopulmonary process. Electronically Signed   By: Fonda Field M.D.   On: 06/23/2023 22:47     Procedures    Medications Ordered in the ED  ketorolac  (TORADOL ) 15 MG/ML injection 15 mg (15 mg Intramuscular Given 06/23/23 2118)                                    Medical Decision Making 33 year old male with past medical history of asthma presenting to the emergency department today with right lower chest wall pain.  I will further evaluate the patient here with basic labs well as an EKG, chest x-ray, troponin to further evaluate for ACS, pulmonary edema, pulmonary infiltrates, or pneumothorax.  His symptoms started just prior to arrival today.  Will obtain 2 troponins on him here although his pain is relatively reproducible here.  I will give him Toradol  in the event this is due to musculoskeletal pain.  Will reevaluate for ultimate disposition.  Based on his exam suspicion for aortic dissection is low at this time.  Also doubt this due to pulmonary embolism as patient is PERC negative.  The patient's troponins here are negative.  Chest x-ray is unremarkable.  He is discharged with return precautions.  Amount and/or Complexity of Data Reviewed Labs: ordered. Radiology: ordered.  Risk Prescription drug management.        Final diagnoses:  Chest pain, unspecified type    ED Discharge Orders          Ordered    naproxen (NAPROSYN) 500 MG tablet  2 times daily        06/23/23 2227               Ula Barter SAUNDERS, MD 06/23/23 [phone]

## 2023-06-23 NOTE — Discharge Instructions (Signed)
 Your workup today was reassuring.  Please take the naproxen twice daily.  Please call to schedule a follow-up appointment with the primary care doctor at the number provided.  Return to the ER for worsening symptoms.

## 2023-06-23 NOTE — ED Triage Notes (Signed)
 POV/ c/o CP/ x 2 weeks ago/ intermittent/ worsening today/ denies SHOB/ hx of asthma/ no cardiac hx/ pt A&Ox4

## 2023-08-11 ENCOUNTER — Emergency Department (HOSPITAL_COMMUNITY)
Admission: EM | Admit: 2023-08-11 | Discharge: 2023-08-11 | Disposition: A | Discharge status: Home / Self Care | Payer: Self-pay | Attending: Emergency Medicine | Admitting: Emergency Medicine

## 2023-08-11 DIAGNOSIS — F22 Delusional disorders: Secondary | ICD-10-CM | POA: Insufficient documentation

## 2023-08-11 DIAGNOSIS — R443 Hallucinations, unspecified: Secondary | ICD-10-CM | POA: Insufficient documentation

## 2023-08-11 DIAGNOSIS — R000 Tachycardia, unspecified: Secondary | ICD-10-CM | POA: Insufficient documentation

## 2023-08-11 NOTE — ED Notes (Signed)
 Pt left without discharge paperwork.

## 2023-08-11 NOTE — ED Provider Notes (Addendum)
 [place] EMERGENCY DEPARTMENT AT [facility] Provider Note   CSN: [id number] Arrival date & time: 08/11/23  1139     Patient presents with: Paranoid   Randy Molina is a 33 y.o. male.   HPI 33 year old male presents via EMS.  Reportedly he jumped out of the second story of his complex and started running.  Police were called by a bystander.  He reportedly has a psychiatric disorder but is not on medicines.  EMS reports he has been calm with them.  While in the room and discussing with patient, his wife is on the phone on speaker.  They endorse that he has voodo black magic on him and they are working to get this taken off of him and he has an appointment in [place] tomorrow.  The wife reports that he hallucinated earlier but is now fine.  She has no concern for her safety or his safety.  Patient has not displayed aggressive behavior with her according to her.  He denies any SI.  He states he otherwise feels fine with no acute complaints.  No injuries from today, and he denies that he jumped out of the window.  The patient does endorse drinking some alcohol this morning.  He denies any illicit drug use.  He is currently asking to leave and declining all workup.  Prior to Admission medications   Medication Sig Start Date End Date Taking? Authorizing Provider  albuterol  (VENTOLIN  HFA) 108 (90 Base) MCG/ACT inhaler Inhale 1-2 puffs into the lungs every 6 (six) hours as needed for wheezing or shortness of breath. 05/03/22   Christopher Savannah, PA-C  amoxicillin -clavulanate (AUGMENTIN ) 875-125 MG tablet Take 1 tablet by mouth 2 (two) times daily. 05/03/22   Christopher Savannah, PA-C  cetirizine  (ZYRTEC  ALLERGY) 10 MG tablet Take 1 tablet (10 mg total) by mouth daily. 05/03/22   Christopher Savannah, PA-C  naproxen  (NAPROSYN ) 500 MG tablet Take 1 tablet (500 mg total) by mouth 2 (two) times daily. 06/23/23   Ula Prentice SAUNDERS, MD  predniSONE  (DELTASONE ) 20 MG tablet Take 2 tablets daily with breakfast. 05/03/22    Christopher Savannah, PA-C  promethazine -dextromethorphan (PROMETHAZINE -DM) 6.25-15 MG/5ML syrup Take 5 mLs by mouth 3 (three) times daily as needed for cough. 05/03/22   Christopher Savannah, PA-C    Allergies: Patient has no known allergies.    Review of Systems  Respiratory:  Negative for shortness of breath.   Cardiovascular:  Negative for chest pain.  Psychiatric/Behavioral:  Negative for suicidal ideas.     Updated Vital Signs BP 136/87 (BP Location: Right Arm)   Pulse (!) 102   Resp 18   SpO2 99%   Physical Exam Vitals and nursing note reviewed.  Constitutional:      Appearance: He is well-developed.  HENT:     Head: Normocephalic and atraumatic.  Cardiovascular:     Rate and Rhythm: Regular rhythm. Tachycardia present.     Heart sounds: Normal heart sounds.     Comments: HR~100, low 100s Pulmonary:     Effort: Pulmonary effort is normal.     Breath sounds: Normal breath sounds.  Abdominal:     General: There is no distension.  Musculoskeletal:     Cervical back: No rigidity.  Skin:    General: Skin is warm and dry.  Neurological:     Mental Status: He is alert and oriented to person, place, and time.     Comments: Patient is alert and oriented to person, place, time, situation.  Psychiatric:        Attention and Perception: He does not perceive auditory or visual hallucinations.        Speech: Speech is not rapid and pressured.        Behavior: Behavior is not agitated or aggressive.        Thought Content: Thought content does not include homicidal or suicidal ideation.     Comments: No active hallucinations     (all labs ordered are listed, but only abnormal results are displayed) Labs Reviewed - No data to display  EKG: None  Radiology: No results found.   Procedures   Medications Ordered in the ED - No data to display                                  Medical Decision Making  I had a conversation with patient, as well as his wife who is on the phone (but in  the waiting room with kids) as well as with this nurse who was in the room.  She and he reports that he saw something/someone in the house and got scared. We discussed that I am concerned about a potential psychiatric or metabolic cause for his symptoms and recommended blood work and evaluation in the emergency department.  However both of them are more concerned this is voodoo magic and would prefer for him to be discharged.  While I obviously think that is unlikely, at this point he is alert, oriented, is not suicidal, homicidal, agitated or intoxicated on my exam.  While I do think that he has some level of psychiatric disease causing his symptoms, possibly drug or alcohol related in general as well, I do not think that he meets criteria for involuntary commitment, and so I do not think that there is any reason to hold him here against his will.  I did stress it would be better to stay for evaluation but he still declines and wants to leave.  He will leave AMA but be encouraged to return at any time.     Final diagnoses:  Hallucinations       Freddi Hamilton, MD 08/11/23 1208

## 2023-08-11 NOTE — ED Notes (Signed)
 Pt and wife refusing all medical treatment, lab work, and imaging. Pt notified of the risks and benefits from the doctor, pt proceeds to refuse help. Pt states he will go get his voodoo lifted tomorrow. Pt and wife request to be discharged.

## 2023-08-11 NOTE — Discharge Instructions (Signed)
 We discussed doing blood work and further evaluation to evaluate your concerns today.  You have decided to leave AGAINST MEDICAL ADVICE.  However, if you change your mind, develop any new or worsening symptoms, or have any other concerns please know that you are encouraged to and recommended to come back to the ER or call 911.  In the meantime you can also follow-up with the behavioral health center as needed.

## 2023-08-11 NOTE — ED Triage Notes (Signed)
 Pt BIB ems for paranoid schizophrenia, but pt and his wife states he does not have schizophrenia, instead someone has done voodoo on him and tomorrow they are getting the voodoo spirits lifted, they paid someone 500 dollars to do so. VS stable

## 2023-10-30 ENCOUNTER — Encounter (HOSPITAL_BASED_OUTPATIENT_CLINIC_OR_DEPARTMENT_OTHER): Payer: Self-pay

## 2023-10-30 ENCOUNTER — Ambulatory Visit
Admission: EM | Admit: 2023-10-30 | Discharge: 2023-10-30 | Disposition: A | Discharge status: Other Acute Inpatient Hospital | Payer: Self-pay | Attending: Family Medicine | Admitting: Family Medicine

## 2023-10-30 ENCOUNTER — Other Ambulatory Visit: Payer: Self-pay

## 2023-10-30 ENCOUNTER — Emergency Department (HOSPITAL_BASED_OUTPATIENT_CLINIC_OR_DEPARTMENT_OTHER): Payer: Self-pay

## 2023-10-30 ENCOUNTER — Emergency Department (HOSPITAL_BASED_OUTPATIENT_CLINIC_OR_DEPARTMENT_OTHER)
Admission: EM | Admit: 2023-10-30 | Discharge: 2023-10-30 | Disposition: A | Discharge status: Home / Self Care | Payer: Self-pay | Attending: Emergency Medicine | Admitting: Emergency Medicine

## 2023-10-30 DIAGNOSIS — R1031 Right lower quadrant pain: Secondary | ICD-10-CM

## 2023-10-30 DIAGNOSIS — R1011 Right upper quadrant pain: Secondary | ICD-10-CM | POA: Insufficient documentation

## 2023-10-30 DIAGNOSIS — R112 Nausea with vomiting, unspecified: Secondary | ICD-10-CM

## 2023-10-30 DIAGNOSIS — R42 Dizziness and giddiness: Secondary | ICD-10-CM

## 2023-10-30 LAB — CBC WITH DIFFERENTIAL/PLATELET
Abs Immature Granulocytes: 0.01 K/uL (ref 0.00–0.07)
Basophils Absolute: 0 K/uL (ref 0.0–0.1)
Basophils Relative: 0 %
Eosinophils Absolute: 0.1 K/uL (ref 0.0–0.5)
Eosinophils Relative: 2 %
HCT: 42 % (ref 39.0–52.0)
Hemoglobin: 13.9 g/dL (ref 13.0–17.0)
Immature Granulocytes: 0 %
Lymphocytes Relative: 41 %
Lymphs Abs: 3.1 K/uL (ref 0.7–4.0)
MCH: 30.8 pg (ref 26.0–34.0)
MCHC: 33.1 g/dL (ref 30.0–36.0)
MCV: 93.1 fL (ref 80.0–100.0)
Monocytes Absolute: 0.5 K/uL (ref 0.1–1.0)
Monocytes Relative: 7 %
Neutro Abs: 3.7 K/uL (ref 1.7–7.7)
Neutrophils Relative %: 50 %
Platelets: 179 K/uL (ref 150–400)
RBC: 4.51 MIL/uL (ref 4.22–5.81)
RDW: 11.9 % (ref 11.5–15.5)
WBC: 7.5 K/uL (ref 4.0–10.5)
nRBC: 0 % (ref 0.0–0.2)

## 2023-10-30 LAB — COMPREHENSIVE METABOLIC PANEL WITH GFR
ALT: 12 U/L (ref 0–44)
AST: 21 U/L (ref 15–41)
Albumin: 4.3 g/dL (ref 3.5–5.0)
Alkaline Phosphatase: 82 U/L (ref 38–126)
Anion gap: 12 (ref 5–15)
BUN: 15 mg/dL (ref 6–20)
CO2: 25 mmol/L (ref 22–32)
Calcium: 9.1 mg/dL (ref 8.9–10.3)
Chloride: 102 mmol/L (ref 98–111)
Creatinine, Ser: 1.06 mg/dL (ref 0.61–1.24)
GFR, Estimated: >60 mL/min (ref >60)
Glucose, Bld: 100 mg/dL — ABNORMAL HIGH (ref 70–99)
Potassium: 4.1 mmol/L (ref 3.5–5.1)
Sodium: 139 mmol/L (ref 135–145)
Total Bilirubin: 0.4 mg/dL (ref 0.0–1.2)
Total Protein: 6.9 g/dL (ref 6.5–8.1)

## 2023-10-30 LAB — URINALYSIS, ROUTINE W REFLEX MICROSCOPIC
Bilirubin Urine: NEGATIVE
Glucose, UA: NEGATIVE mg/dL
Hgb urine dipstick: NEGATIVE
Ketones, ur: NEGATIVE mg/dL
Leukocytes,Ua: NEGATIVE
Nitrite: NEGATIVE
Protein, ur: NEGATIVE mg/dL
Specific Gravity, Urine: 1.025 (ref 1.005–1.030)
pH: 6.5 (ref 5.0–8.0)

## 2023-10-30 LAB — LIPASE, BLOOD: Lipase: 18 U/L (ref 11–51)

## 2023-10-30 MED ORDER — DICYCLOMINE HCL 20 MG PO TABS: 20.0000 mg | ORAL_TABLET | Freq: Two times a day (BID) | ORAL | 0 refills | Status: AC

## 2023-10-30 MED ORDER — DICYCLOMINE HCL 10 MG PO CAPS
20.0000 mg | ORAL_CAPSULE | Freq: Once | ORAL | Status: AC
  Administered 2023-10-30: 20 mg via ORAL
  Filled 2023-10-30: qty 2

## 2023-10-30 MED ORDER — IOHEXOL 300 MG/ML  SOLN
100.0000 mL | Freq: Once | INTRAMUSCULAR | Status: AC | PRN
  Administered 2023-10-30: 100 mL via INTRAVENOUS

## 2023-10-30 NOTE — ED Notes (Signed)
 Patient is being discharged from the Urgent Care and sent to the Emergency Department via POV . Per Myla Bold, NP, patient is in need of higher level of care due to needing further testing. Patient is aware and verbalizes understanding of plan of care.  Vitals:   10/30/23 1434  BP: 99/63  Pulse: 80  Resp: 17  Temp: 98.6 F (37 C)  SpO2: 98%

## 2023-10-30 NOTE — ED Notes (Signed)
 Pt returned from CT. Tolerated well. Placed back on monitor, awaiting results

## 2023-10-30 NOTE — ED Triage Notes (Signed)
 Pt present with c/o dizziness, stomach pain and headaches x 1 week. Pt states he has taken tylenol  and aspirin. Pt states when he eats he feels nauseous and starts to vomit.

## 2023-10-30 NOTE — ED Notes (Signed)
Patient transported to CT via WC

## 2023-10-30 NOTE — ED Notes (Signed)
 Report received from [place], [place]. Assuming pt care at this time.

## 2023-10-30 NOTE — ED Provider Notes (Signed)
 Care assumed from previous provider at shift change.  See note for full HPI.  In summation, 33 yo here for abd pain. No BRBPR. Taking Pepto. Pain to RUQ, RLQ. Plan on FU on CT imaging. If neg plan to dc home Physical Exam  BP (!) 122/105   Pulse 81   Temp 98.2 F (36.8 C) (Oral)   Resp 18   Ht 5' 7 (1.702 m)   Wt 70.3 kg   SpO2 100%   BMI 24.28 kg/m   Physical Exam Vitals and nursing note reviewed.  Constitutional:      General: He is not in acute distress.    Appearance: He is well-developed. He is not ill-appearing or diaphoretic.  HENT:     Head: Atraumatic.  Eyes:     Pupils: Pupils are equal, round, and reactive to light.  Cardiovascular:     Rate and Rhythm: Normal rate and regular rhythm.  Pulmonary:     Effort: Pulmonary effort is normal. No respiratory distress.  Abdominal:     General: There is no distension.     Palpations: Abdomen is soft.  Musculoskeletal:        General: Normal range of motion.     Cervical back: Normal range of motion and neck supple.  Skin:    General: Skin is warm and dry.  Neurological:     General: No focal deficit present.     Mental Status: He is alert and oriented to person, place, and time.     Procedures  Procedures Labs Reviewed  COMPREHENSIVE METABOLIC PANEL WITH GFR - Abnormal; Notable for the following components:      Result Value   Glucose, Bld 100 (*)    All other components within normal limits  CBC WITH DIFFERENTIAL/PLATELET  LIPASE, BLOOD  URINALYSIS, ROUTINE W REFLEX MICROSCOPIC   CT ABDOMEN PELVIS W CONTRAST Result Date: 10/30/2023 CLINICAL DATA:  Right lower quadrant abdominal pain. EXAM: CT ABDOMEN AND PELVIS WITH CONTRAST TECHNIQUE: Multidetector CT imaging of the abdomen and pelvis was performed using the standard protocol following bolus administration of intravenous contrast. RADIATION DOSE REDUCTION: This exam was performed according to the departmental dose-optimization program which includes  automated exposure control, adjustment of the mA and/or kV according to patient size and/or use of iterative reconstruction technique. CONTRAST:  100mL OMNIPAQUE IOHEXOL 300 MG/ML  SOLN COMPARISON:  None Available. FINDINGS: Lower chest: The visualized lung bases are clear. No intra-abdominal free air or free fluid. Hepatobiliary: The liver is unremarkable. No biliary dilatation. The gallbladder is unremarkable. Pancreas: Unremarkable. No pancreatic ductal dilatation or surrounding inflammatory changes. Spleen: Normal in size without focal abnormality. Adrenals/Urinary Tract: The adrenal glands unremarkable. The kidneys, visualized ureters, and urinary bladder appear unremarkable. Stomach/Bowel: There is no bowel obstruction or active inflammation. No evidence of acute appendicitis. Vascular/Lymphatic: The abdominal aorta and IVC are unremarkable. 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 Clinical correlation recommended to evaluate for possibility of nutcracker syndrome. No portal venous gas. There is no adenopathy. Reproductive: The prostate and seminal vesicles are grossly remarkable. Other: None Musculoskeletal: No acute or significant osseous findings. IMPRESSION: 1. No acute intra-abdominal or pelvic pathology. No evidence of acute appendicitis. 2. 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 Clinical correlation recommended to evaluate for possibility of nutcracker syndrome. Electronically Signed   By: Vanetta Shelia HERO.D.  On: 10/30/2023 18:53    ED Course / MDM    Care assumed from previous writer.  Plan to follow-up on urinalysis and CT scan.  Patient reassessed.  CT scan shows no acute findings however does show possible compression of left renal vein correlate for nutcracker syndrome.  Patient reassessed.  States he has had some intermittent pain to  his left flank.  Pain today is more in his right.  I suspect his CT findings are likely incidental.  Will have him take Bentyl as needed for his abdominal pain.  Will have him follow-up with PCP or vascular if he continues to have pain to his left flank. Pain currently controlled.  Patient is nontoxic, nonseptic appearing, in no apparent distress.  Patient's pain and other symptoms adequately managed in emergency department.  Fluid bolus given.  Labs, imaging and vitals reviewed.  Patient does not meet the SIRS or Sepsis criteria.  On repeat exam patient does not have a surgical abdomin and there are no peritoneal signs.  No indication of appendicitis, bowel obstruction, bowel perforation, cholecystitis, diverticulitis, intermittent, persistent torsion, AAA, dissection, traumatic injury, Gi bleed.  Patient discharged home with symptomatic treatment and given strict instructions for follow-up with their primary care physician.  I have also discussed reasons to return immediately to the ER.  Patient expresses understanding and agrees with plan.     Medical Decision Making Amount and/or Complexity of Data Reviewed Independent Historian: spouse External Data Reviewed: labs, radiology and notes. Labs: ordered. Decision-making details documented in ED Course. Radiology: ordered and independent interpretation performed. Decision-making details documented in ED Course.  Risk OTC drugs. Prescription drug management. Decision regarding hospitalization. Diagnosis or treatment significantly limited by social determinants of health.          Edie Rosebud LABOR, PA-C 10/30/23 1928    Patt Alm Macho, MD 10/30/23 2159

## 2023-10-30 NOTE — ED Triage Notes (Signed)
 Friday started having left upper abdominal pain, which now moved to right lower quadrant. C/o dizziness since Friday, feels like he's going to pass out. Also c/o nausea/vomiting when eating, black stool x 1 today

## 2023-10-30 NOTE — ED Notes (Signed)
 Assumed care at this time. Pt in room, on heart monitor. PIV inserted. Blood drawn and walked to lab. Awaiting lab results and CT scan.

## 2023-10-30 NOTE — ED Provider Notes (Signed)
 [place] [facility] AT [facility] Provider Note   CSN: [id number] Arrival date & time: 10/30/23  1518     Patient presents with: Abdominal Pain   Randy Molina is a 33 y.o. male sent in for evaluation of abdominal pain sent in from urgent care.  Patient has several days of progressively worsening abdominal pain which he says started in his left upper quadrant but has now migrated to his right lower quadrant.  He denies nausea or vomiting but has had some shaking chills.  He apparently reported earlier that his stools were dark and he has been taking Pepto-Bismol.  There was some concern that his dark stool started prior to taking the Pepto-Bismol however after phone call with his wife and some clarification appears that his dark stools began after taking Pepto-Bismol.  He denies any vomiting diarrhea or constipation.    Abdominal Pain      Prior to Admission medications   Medication Sig Start Date End Date Taking? Authorizing Provider  albuterol  (VENTOLIN  HFA) 108 (90 Base) MCG/ACT inhaler Inhale 1-2 puffs into the lungs every 6 (six) hours as needed for wheezing or shortness of breath. 05/03/22   Christopher Savannah, PA-C  amoxicillin -clavulanate (AUGMENTIN ) 875-125 MG tablet Take 1 tablet by mouth 2 (two) times daily. 05/03/22   Christopher Savannah, PA-C  cetirizine  (ZYRTEC  ALLERGY) 10 MG tablet Take 1 tablet (10 mg total) by mouth daily. 05/03/22   Christopher Savannah, PA-C  naproxen  (NAPROSYN ) 500 MG tablet Take 1 tablet (500 mg total) by mouth 2 (two) times daily. 06/23/23   Ula Prentice SAUNDERS, MD  predniSONE  (DELTASONE ) 20 MG tablet Take 2 tablets daily with breakfast. 05/03/22   Christopher Savannah, PA-C  promethazine -dextromethorphan (PROMETHAZINE -DM) 6.25-15 MG/5ML syrup Take 5 mLs by mouth 3 (three) times daily as needed for cough. 05/03/22   Christopher Savannah, PA-C    Allergies: Patient has no known allergies.    Review of Systems  Gastrointestinal:  Positive for abdominal pain.    Updated Vital  Signs BP 116/78 (BP Location: Right Arm)   Pulse 66   Temp 98.2 F (36.8 C) (Oral)   Resp 16   SpO2 99%   Physical Exam Vitals and nursing note reviewed.  Constitutional:      General: He is not in acute distress.    Appearance: He is well-developed. He is not diaphoretic.  HENT:     Head: Normocephalic and atraumatic.  Eyes:     General: No scleral icterus.    Conjunctiva/sclera: Conjunctivae normal.  Cardiovascular:     Rate and Rhythm: Normal rate and regular rhythm.     Heart sounds: Normal heart sounds.  Pulmonary:     Effort: Pulmonary effort is normal. No respiratory distress.     Breath sounds: Normal breath sounds.  Abdominal:     Palpations: Abdomen is soft.     Tenderness: There is abdominal tenderness in the right upper quadrant and right lower quadrant.     Hernia: No hernia is present.  Musculoskeletal:     Cervical back: Normal range of motion and neck supple.  Skin:    General: Skin is warm and dry.  Neurological:     Mental Status: He is alert.  Psychiatric:        Behavior: Behavior normal.     (all labs ordered are listed, but only abnormal results are displayed) Labs Reviewed - No data to display  EKG: None  Radiology: No results found.   Procedures  Medications Ordered in the ED - No data to display                                  Medical Decision Making Amount and/or Complexity of Data Reviewed Labs: ordered. Radiology: ordered.  Risk Prescription drug management.   33 year old male who presents emergency department with chief complaint of abdominal pain. The differential diagnosis for generalized abdominal pain includes, but is not limited to AAA, gastroenteritis, appendicitis, Bowel obstruction, Bowel perforation. Gastroparesis, DKA, Hernia, Inflammatory bowel disease, mesenteric ischemia, pancreatitis, peritonitis SBP, volvulus.    He has migratory pain that localized to his right lower quadrant.  No fever or chills.   Initial labs appear reassuring without elevated white blood cell count.  Patient CT scan is currently pending.  He has declined in the pain medicine although I have ordered [facility] dicyclomine which has been helping with his stomach cramping.  Signout given to PA Henderly at shift change.     Final diagnoses:  None    ED Discharge Orders     None          Arloa Chroman, PA-C 11/02/23 1800    Patt Alm Macho, MD 11/06/23 2302

## 2023-10-30 NOTE — Discharge Instructions (Addendum)
 Please go to the emergency room for further evaluation of your symptoms

## 2023-10-30 NOTE — ED Provider Notes (Signed)
 [facility]    CSN: [id number] Arrival date & time: 10/30/23  1417      History   Chief Complaint Chief Complaint  Patient presents with   Headache   Abdominal Pain   Vomiting    HPI Randy Molina is a 33 y.o. male presents for vomiting and abdominal pain.  Patient reports 4 to 5 days of nonbilious vomiting with black stools.  Also endorses right lower quadrant abdominal pain.  He denies any fevers or cough or congestion/sore throat.  Does endorse headaches with dizziness.  States he was unable to keep any food or fluids down.  No history of GI diagnoses including Crohn's, IBS, colitis, diverticulitis.  No known sick contacts.  He tried Pepto-Bismol and Tylenol  without improvement.  No other concerns at this time.   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  No RWMA.  Normal BiV size.  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 => Increased trabeculations is a benign finding that could lead to EKG abnormality.   Graduated Exercise Tolerance Test  02/08/2021   Excellent exercise capacity-12.5 METS- only reached 77% MPHR. - No chest pain. ==> At max tolerated exercise, no evidence of ischemia would be relatively consistent with no myocardial ischemia despite  not reaching 85% MPHR.   None     TRANSTHORACIC ECHOCARDIOGRAM  02/08/2021   Increased trabeculation from mid to apical portion of the left ventricle (does not meet ratio of >2:1 to be called noncompaction)-recommend cardiac MRI.  EF estimated 60 to 65%.  Normal RV normal valves.       Home Medications    Prior to Admission medications   Medication Sig Start Date End Date Taking? Authorizing Provider  albuterol  (VENTOLIN  HFA) 108 (90 Base) MCG/ACT inhaler Inhale 1-2 puffs into the lungs every 6 (six) hours as needed for wheezing or shortness of breath. 05/03/22   Christopher Savannah, PA-C  amoxicillin -clavulanate (AUGMENTIN ) 875-125 MG tablet Take 1 tablet by mouth 2 (two) times daily. 05/03/22   Christopher Savannah, PA-C  cetirizine  (ZYRTEC  ALLERGY) 10 MG tablet Take 1 tablet (10 mg total) by mouth daily. 05/03/22   Christopher Savannah, PA-C  naproxen  (NAPROSYN ) 500 MG tablet Take 1 tablet (500 mg total) by mouth 2 (two) times daily. 06/23/23   Ula Prentice SAUNDERS, MD  predniSONE  (DELTASONE ) 20 MG tablet Take 2 tablets daily with breakfast. 05/03/22   Christopher Savannah, PA-C  promethazine -dextromethorphan (PROMETHAZINE -DM) 6.25-15 MG/5ML syrup Take 5 mLs by mouth 3 (three) times daily as needed for cough. 05/03/22   Christopher Savannah, PA-C    Family History Family History  Problem Relation Age of Onset   Lung cancer Mother 48   Cancer Father 55       Started off and jaw, recurred after resection  Social History Social History   Tobacco Use   Smoking status: Former    Types: Cigarettes   Smokeless tobacco: Never  Vaping Use   Vaping status: Every Day  Substance Use Topics   Alcohol use: Yes    Comment: occ   Drug use: Never     Allergies   Patient has no known allergies.   Review of Systems Review of Systems  Gastrointestinal:  Positive for abdominal pain, nausea and vomiting.       Black stool  Neurological:  Positive for dizziness and headaches.     Physical Exam Triage Vital Signs ED Triage Vitals   Encounter Vitals Group     BP 10/30/23 1434 99/63     Girls Systolic BP Percentile --      Girls Diastolic BP Percentile --      Boys Systolic BP Percentile --      Boys Diastolic BP Percentile --      Pulse Rate 10/30/23 1434 80     Resp 10/30/23 1434 17     Temp 10/30/23 1434 98.6 F (37 C)     Temp Source 10/30/23 1434 Oral     SpO2 10/30/23 1434 98 %     Weight --      Height --      Head Circumference --      Peak Flow --      Pain Score 10/30/23 1432 5     Pain Loc --      Pain Education --      Exclude from Growth Chart --    No data found.  Updated Vital Signs BP 99/63 (BP Location: Right Arm)   Pulse 80   Temp 98.6 F (37 C) (Oral)   Resp 17   SpO2 98%   Visual Acuity Right Eye Distance:   Left Eye Distance:   Bilateral Distance:    Right Eye Near:   Left Eye Near:    Bilateral Near:     Physical Exam Vitals and nursing note reviewed.  Constitutional:      General: He is not in acute distress.    Appearance: Normal appearance. He is not ill-appearing.  HENT:     Head: Normocephalic and atraumatic.  Eyes:     Pupils: Pupils are equal, round, and reactive to light.  Cardiovascular:     Rate and Rhythm: Normal rate.  Pulmonary:     Effort: Pulmonary effort is normal.  Abdominal:     General: Abdomen is flat. Bowel sounds are normal.     Palpations: Abdomen is soft. There is no hepatomegaly or splenomegaly.     Tenderness: There is abdominal tenderness in the right lower quadrant. There is guarding. There is no rebound. Negative signs include Rovsing's sign and McBurney's sign.  Skin:    General: Skin is warm and dry.  Neurological:     General: No focal deficit present.     Mental Status: He is alert and oriented to person, place, and time.  Psychiatric:        Mood and Affect: Mood normal.        Behavior: Behavior normal.      UC Treatments / Results  Labs (all labs ordered are listed, but only abnormal results are displayed) Labs  Reviewed - No data to display  EKG   Radiology No results found.  Procedures Procedures (including critical care time)  Medications Ordered in UC Medications - No data to display  Initial Impression /  Assessment and Plan / UC Course  I have reviewed the triage vital signs and the nursing notes.  Pertinent labs & imaging results that were available during my care of the patient were reviewed by me and considered in my medical decision making (see chart for details).     Reviewed exam and symptoms with patient.  He is presenting with 4 days of nausea, vomiting with right lower quadrant abdominal pain and inability to stay hydrated or eat any foods.  States he also had black stools that started before he started taking Pepto-Bismol.  Given his symptoms and exam advised to go to the emergency room for further evaluation and treatment.  He is in agreement with plan and will go POV to the emergency room. Final Clinical Impressions(s) / UC Diagnoses   Final diagnoses:  Right lower quadrant abdominal pain  Nausea and vomiting, unspecified vomiting type  Dizziness     Discharge Instructions      Please go to the emergency room for further evaluation of your symptoms    ED Prescriptions   None    PDMP not reviewed this encounter.   Loreda Myla SAUNDERS, NP 10/30/23 [phone]

## 2023-10-30 NOTE — Discharge Instructions (Addendum)
 It was a pleasure taking care of you here today I written you for medication help with your symptoms.  Make sure to follow-up outpatient.  As we discussed you possibly have compression of the vein to your left kidney.  If you continue to have pain in this area follow-up with the vascular surgeon.  Their number is listed in your discharge paperwork.  You will need call to schedule appointment  Return for new or worsening symptoms

## 2024-01-06 ENCOUNTER — Emergency Department (HOSPITAL_BASED_OUTPATIENT_CLINIC_OR_DEPARTMENT_OTHER)
Admission: EM | Admit: 2024-01-06 | Discharge: 2024-01-06 | Disposition: A | Discharge status: Home / Self Care | Payer: Self-pay | Source: Home / Self Care | Attending: Emergency Medicine | Admitting: Emergency Medicine

## 2024-01-06 ENCOUNTER — Emergency Department (HOSPITAL_BASED_OUTPATIENT_CLINIC_OR_DEPARTMENT_OTHER): Payer: Self-pay

## 2024-01-06 ENCOUNTER — Other Ambulatory Visit: Payer: Self-pay

## 2024-01-06 ENCOUNTER — Emergency Department (HOSPITAL_BASED_OUTPATIENT_CLINIC_OR_DEPARTMENT_OTHER)
Admission: EM | Admit: 2024-01-06 | Discharge: 2024-01-06 | Discharge status: Against Medical Advice | Payer: Self-pay | Source: Home / Self Care | Attending: Emergency Medicine | Admitting: Emergency Medicine

## 2024-01-06 ENCOUNTER — Encounter (HOSPITAL_BASED_OUTPATIENT_CLINIC_OR_DEPARTMENT_OTHER): Payer: Self-pay

## 2024-01-06 DIAGNOSIS — J3489 Other specified disorders of nose and nasal sinuses: Secondary | ICD-10-CM | POA: Insufficient documentation

## 2024-01-06 DIAGNOSIS — F17210 Nicotine dependence, cigarettes, uncomplicated: Secondary | ICD-10-CM | POA: Insufficient documentation

## 2024-01-06 DIAGNOSIS — R000 Tachycardia, unspecified: Secondary | ICD-10-CM | POA: Insufficient documentation

## 2024-01-06 DIAGNOSIS — J029 Acute pharyngitis, unspecified: Secondary | ICD-10-CM | POA: Insufficient documentation

## 2024-01-06 DIAGNOSIS — Z5321 Procedure and treatment not carried out due to patient leaving prior to being seen by health care provider: Secondary | ICD-10-CM | POA: Insufficient documentation

## 2024-01-06 DIAGNOSIS — R109 Unspecified abdominal pain: Secondary | ICD-10-CM | POA: Insufficient documentation

## 2024-01-06 DIAGNOSIS — R0981 Nasal congestion: Secondary | ICD-10-CM | POA: Insufficient documentation

## 2024-01-06 DIAGNOSIS — R052 Subacute cough: Secondary | ICD-10-CM | POA: Insufficient documentation

## 2024-01-06 DIAGNOSIS — J45909 Unspecified asthma, uncomplicated: Secondary | ICD-10-CM | POA: Insufficient documentation

## 2024-01-06 DIAGNOSIS — M2550 Pain in unspecified joint: Secondary | ICD-10-CM | POA: Insufficient documentation

## 2024-01-06 DIAGNOSIS — R059 Cough, unspecified: Secondary | ICD-10-CM | POA: Insufficient documentation

## 2024-01-06 DIAGNOSIS — M791 Myalgia, unspecified site: Secondary | ICD-10-CM | POA: Insufficient documentation

## 2024-01-06 LAB — COMPREHENSIVE METABOLIC PANEL WITH GFR
ALT: 12 U/L (ref 0–44)
AST: 23 U/L (ref 15–41)
Albumin: 4.3 g/dL (ref 3.5–5.0)
Alkaline Phosphatase: 64 U/L (ref 38–126)
Anion gap: 12 (ref 5–15)
BUN: 19 mg/dL (ref 6–20)
CO2: 25 mmol/L (ref 22–32)
Calcium: 8.8 mg/dL — ABNORMAL LOW (ref 8.9–10.3)
Chloride: 106 mmol/L (ref 98–111)
Creatinine, Ser: 1.14 mg/dL (ref 0.61–1.24)
GFR, Estimated: >60 mL/min
Glucose, Bld: 121 mg/dL — ABNORMAL HIGH (ref 70–99)
Potassium: 3.4 mmol/L — ABNORMAL LOW (ref 3.5–5.1)
Sodium: 143 mmol/L (ref 135–145)
Total Bilirubin: 0.3 mg/dL (ref 0.0–1.2)
Total Protein: 6.5 g/dL (ref 6.5–8.1)

## 2024-01-06 LAB — CBC WITH DIFFERENTIAL/PLATELET
Abs Immature Granulocytes: 0.04 K/uL (ref 0.00–0.07)
Basophils Absolute: 0 K/uL (ref 0.0–0.1)
Basophils Relative: 0 %
Eosinophils Absolute: 0.1 K/uL (ref 0.0–0.5)
Eosinophils Relative: 1 %
HCT: 37.8 % — ABNORMAL LOW (ref 39.0–52.0)
Hemoglobin: 12.6 g/dL — ABNORMAL LOW (ref 13.0–17.0)
Immature Granulocytes: 0 %
Lymphocytes Relative: 26 %
Lymphs Abs: 3.2 K/uL (ref 0.7–4.0)
MCH: 30.8 pg (ref 26.0–34.0)
MCHC: 33.3 g/dL (ref 30.0–36.0)
MCV: 92.4 fL (ref 80.0–100.0)
Monocytes Absolute: 0.7 K/uL (ref 0.1–1.0)
Monocytes Relative: 5 %
Neutro Abs: 8.2 K/uL — ABNORMAL HIGH (ref 1.7–7.7)
Neutrophils Relative %: 68 %
Platelets: 175 K/uL (ref 150–400)
RBC: 4.09 MIL/uL — ABNORMAL LOW (ref 4.22–5.81)
RDW: 12.8 % (ref 11.5–15.5)
WBC: 12.3 K/uL — ABNORMAL HIGH (ref 4.0–10.5)
nRBC: 0 % (ref 0.0–0.2)

## 2024-01-06 LAB — URINALYSIS, W/ REFLEX TO CULTURE (INFECTION SUSPECTED)
Bilirubin Urine: NEGATIVE
Glucose, UA: NEGATIVE mg/dL
Hgb urine dipstick: NEGATIVE
Ketones, ur: NEGATIVE mg/dL
Leukocytes,Ua: NEGATIVE
Nitrite: NEGATIVE
Protein, ur: NEGATIVE mg/dL
RBC / HPF: NONE SEEN RBC/hpf (ref 0–5)
Specific Gravity, Urine: 1.02 (ref 1.005–1.030)
pH: 6.5 (ref 5.0–8.0)

## 2024-01-06 LAB — RESP PANEL BY RT-PCR (RSV, FLU A&B, COVID)  RVPGX2
Influenza A by PCR: NEGATIVE
Influenza B by PCR: NEGATIVE
Resp Syncytial Virus by PCR: NEGATIVE
SARS Coronavirus 2 by RT PCR: NEGATIVE

## 2024-01-06 LAB — MONONUCLEOSIS SCREEN: Mono Screen: NEGATIVE

## 2024-01-06 MED ORDER — BENZONATATE 100 MG PO CAPS
200.0000 mg | ORAL_CAPSULE | Freq: Once | ORAL | Status: AC
  Administered 2024-01-06: 200 mg via ORAL
  Filled 2024-01-06: qty 2

## 2024-01-06 MED ORDER — ONDANSETRON HCL 4 MG/2ML IJ SOLN
4.0000 mg | Freq: Once | INTRAMUSCULAR | Status: AC
  Administered 2024-01-06: 4 mg via INTRAVENOUS
  Filled 2024-01-06: qty 2

## 2024-01-06 MED ORDER — ACETAMINOPHEN 500 MG PO TABS
1000.0000 mg | ORAL_TABLET | Freq: Once | ORAL | Status: AC
  Administered 2024-01-06: 1000 mg via ORAL
  Filled 2024-01-06: qty 2

## 2024-01-06 MED ORDER — IOHEXOL 300 MG/ML  SOLN
80.0000 mL | Freq: Once | INTRAMUSCULAR | Status: AC | PRN
  Administered 2024-01-06: 80 mL via INTRAVENOUS

## 2024-01-06 MED ORDER — LACTATED RINGERS IV BOLUS
1000.0000 mL | Freq: Once | INTRAVENOUS | Status: AC
  Administered 2024-01-06: 1000 mL via INTRAVENOUS

## 2024-01-06 MED ORDER — BENZONATATE 100 MG PO CAPS: 100.0000 mg | ORAL_CAPSULE | Freq: Three times a day (TID) | ORAL | 0 refills | Status: AC

## 2024-01-06 MED ORDER — AZITHROMYCIN 250 MG PO TABS: 250.0000 mg | ORAL_TABLET | Freq: Every day | ORAL | 0 refills | Status: AC

## 2024-01-06 NOTE — ED Triage Notes (Signed)
 Pt reports flu like S/S. Pt LWBS earlier and is back.

## 2024-01-06 NOTE — ED Provider Notes (Signed)
 " [facility] Provider Note  CSN: [id number] Arrival date & time: 01/06/24 1733  Chief Complaint(s) Cough  HPI Randy Molina is a 34 y.o. male who is here today for 2 weeks of feeling unwell.  He reports that he has had abdominal pain, nausea, sore throat and cough.   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 Authorizing Provider  azithromycin  (ZITHROMAX ) 250 MG tablet Take 1 tablet (250 mg total) by mouth daily. Take first 2 tablets together, then 1 every day until finished. 01/06/24  Yes Mannie Pac T, DO  benzonatate  (TESSALON ) 100 MG capsule Take 1 capsule (100 mg total) by mouth every 8 (eight) hours. 01/06/24  Yes Mannie Pac T, DO  albuterol  (VENTOLIN  HFA) 108 (90 Base) MCG/ACT inhaler Inhale 1-2 puffs into the lungs every 6 (six) hours as needed for wheezing or shortness of breath. 05/03/22   Christopher Savannah, PA-C  amoxicillin -clavulanate (AUGMENTIN ) 875-125 MG tablet Take 1 tablet by mouth 2 (two) times daily. 05/03/22   Christopher Savannah, PA-C  cetirizine  (ZYRTEC  ALLERGY) 10 MG tablet Take 1 tablet (10 mg total) by mouth daily. 05/03/22   Christopher Savannah, PA-C  dicyclomine  (BENTYL ) 20 MG tablet Take 1 tablet (20 mg total) by mouth 2 (two) times daily. 10/30/23   Henderly, Britni A, PA-C  naproxen  (NAPROSYN ) 500 MG tablet Take 1 tablet (500 mg total) by mouth 2 (two) times daily. 06/23/23   Ula Prentice SAUNDERS, MD  predniSONE   (DELTASONE ) 20 MG tablet Take 2 tablets daily with breakfast. 05/03/22   Christopher Savannah, PA-C  promethazine -dextromethorphan (PROMETHAZINE -DM) 6.25-15 MG/5ML syrup Take 5 mLs by mouth 3 (three) times daily as needed for cough. 05/03/22   Christopher Savannah, PA-C                                                                                                                                    Past Surgical History Past Surgical History:  Procedure Laterality Date   Cardiac MRI Bilateral 04/11/2021   LVEF 57%.  No RWMA.  Normal BiV size.  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 => Increased trabeculations is a benign finding that could lead to EKG abnormality.   Graduated Exercise Tolerance Test  02/08/2021   Excellent exercise capacity-12.5 METS- only reached 77% MPHR. - No chest pain. ==> At max tolerated  exercise, no evidence of ischemia would be relatively consistent with no myocardial ischemia despite not reaching 85% MPHR.   None     TRANSTHORACIC ECHOCARDIOGRAM  02/08/2021   Increased trabeculation from mid to apical portion of the left ventricle (does not meet ratio of >2:1 to be called noncompaction)-recommend cardiac MRI.  EF estimated 60 to 65%.  Normal RV normal valves.   Family History Family History  Problem Relation Age of Onset   Lung cancer Mother 18   Cancer Father 26       Started off and jaw, recurred after resection    Social History Social History[1] Allergies Patient has no known allergies.  Review of Systems Review of Systems  Physical Exam Vital Signs  I have reviewed the triage vital signs BP 114/64   Pulse 66   Temp 98.4 F (36.9 C) (Oral)   Resp 16   Ht 5' 7 (1.702 m)   Wt 73.5 kg   SpO2 99%   BMI 25.38 kg/m   Physical Exam Vitals and nursing note reviewed.  Constitutional:      Appearance: He is not toxic-appearing.  HENT:     Head: Normocephalic.     Nose: Congestion present.  Eyes:      Pupils: Pupils are equal, round, and reactive to light.  Cardiovascular:     Rate and Rhythm: Tachycardia present.  Pulmonary:     Effort: Pulmonary effort is normal.  Abdominal:     General: Abdomen is flat.     Palpations: Abdomen is soft.     Tenderness: There is abdominal tenderness. There is no guarding.  Neurological:     General: No focal deficit present.     Mental Status: He is alert.     ED Results and Treatments Labs (all labs ordered are listed, but only abnormal results are displayed) Labs Reviewed  COMPREHENSIVE METABOLIC PANEL WITH GFR - Abnormal; Notable for the following components:      Result Value   Potassium 3.4 (*)    Glucose, Bld 121 (*)    Calcium 8.8 (*)    All other components within normal limits  CBC WITH DIFFERENTIAL/PLATELET - Abnormal; Notable for the following components:   WBC 12.3 (*)    RBC 4.09 (*)    Hemoglobin 12.6 (*)    HCT 37.8 (*)    Neutro Abs 8.2 (*)    All other components within normal limits  URINALYSIS, W/ REFLEX TO CULTURE (INFECTION SUSPECTED) - Abnormal; Notable for the following components:   Bacteria, UA RARE (*)    All other components within normal limits  RESP PANEL BY RT-PCR (RSV, FLU A&B, COVID)  RVPGX2  MONONUCLEOSIS SCREEN                                                                                                                          Radiology CT ABDOMEN PELVIS W CONTRAST Result Date: 01/06/2024 EXAM: CT ABDOMEN AND PELVIS WITH CONTRAST 01/06/2024 08:38:42  PM TECHNIQUE: CT of the abdomen and pelvis was performed with the administration of 80 mL of iohexol  (OMNIPAQUE ) 300 MG/ML solution. Multiplanar reformatted images are provided for review. Automated exposure control, iterative reconstruction, and/or weight-based adjustment of the mA/kV was utilized to reduce the radiation dose to as low as reasonably achievable. COMPARISON: 10/30/2023 CLINICAL HISTORY: Appendicitis suspected. Flu-like symptoms. FINDINGS:  LOWER CHEST: Lung bases are clear. LIVER: The liver is unremarkable. GALLBLADDER AND BILE DUCTS: The gallbladder is contracted, likely physiologic. No biliary ductal dilatation. SPLEEN: No acute abnormality. PANCREAS: No acute abnormality. ADRENAL GLANDS: No acute abnormality. KIDNEYS, URETERS AND BLADDER: No stones in the kidneys or ureters. No hydronephrosis. No perinephric or periureteral stranding. Urinary bladder is unremarkable. GI AND BOWEL: Stomach demonstrates no acute abnormality. The stomach, small bowel, and colon are not abnormally distended. No wall thickening or inflammatory stranding is appreciated. Scattered stool throughout the colon. The appendix is normal. There is no bowel obstruction. PERITONEUM AND RETROPERITONEUM: No ascites. No free air. VASCULATURE: Aorta is normal in caliber. LYMPH NODES: No lymphadenopathy. REPRODUCTIVE ORGANS: The prostate gland is unremarkable. BONES AND SOFT TISSUES: No acute osseous abnormality. No focal soft tissue abnormality. IMPRESSION: 1. No CT evidence of appendicitis. Electronically signed by: Elsie Gravely MD 01/06/2024 08:47 PM EST RP Workstation: [id number]   DG Chest Port 1 View Result Date: 01/06/2024 EXAM: 1 VIEW(S) XRAY OF THE CHEST 01/06/2024 08:08:00 PM COMPARISON: 01/06/2024 CLINICAL HISTORY: Fever. FINDINGS: LUNGS AND PLEURA: No focal pulmonary opacity. No pleural effusion. No pneumothorax. HEART AND MEDIASTINUM: No acute abnormality of the cardiac and mediastinal silhouettes. BONES AND SOFT TISSUES: No acute osseous abnormality. IMPRESSION: 1. No active cardiopulmonary disease. Electronically signed by: Franky Crease MD 01/06/2024 08:13 PM EST RP Workstation: [id number]   DG Chest 2 View Result Date: 01/06/2024 CLINICAL DATA:  cough EXAM: CHEST - 2 VIEW COMPARISON:  June 23, 2023 FINDINGS: The cardiomediastinal silhouette is normal in contour. No pleural effusion. No pneumothorax. No acute pleuroparenchymal abnormality. Visualized abdomen is  unremarkable. No acute osseous abnormality noted. IMPRESSION: No acute cardiopulmonary abnormality. Electronically Signed   By: Corean Salter M.D.   On: 01/06/2024 13:45    Pertinent labs & imaging results that were available during my care of the patient were reviewed by me and considered in my medical decision making (see MDM for details).  Medications Ordered in ED Medications  acetaminophen  (TYLENOL ) tablet 1,000 mg (1,000 mg Oral Given 01/06/24 1834)  ondansetron  (ZOFRAN ) injection 4 mg (4 mg Intravenous Given 01/06/24 1947)  lactated ringers  bolus 1,000 mL (1,000 mLs Intravenous New Bag/Given 01/06/24 1955)  iohexol  (OMNIPAQUE ) 300 MG/ML solution 80 mL (80 mLs Intravenous Contrast Given 01/06/24 2031)  benzonatate  (TESSALON ) capsule 200 mg (200 mg Oral Given 01/06/24 2108)  Procedures Procedures  (including critical care time)  Medical Decision Making / ED Course   This patient presents to the ED for concern of abdominal pain, sore throat, cough, congestion for 2 weeks duration, this involves an extensive number of treatment options, and is a complaint that carries with it a high risk of complications and morbidity.  The differential diagnosis includes appendicitis, viral syndrome, enteritis, less likely bacteremia, less likely pneumonia, less likely cystitis.  MDM: Given duration of patient's symptoms, will perform a bit broader workup.  He has some pain in his abdomen, will obtain CT imaging.  Will check urinalysis, chest x-ray and blood work.  Viral swab ordered.  Reassessment 9:15 PM-patient's workup is very benign.  His urine is negative, CT abdomen pelvis, chest x-ray negative, viral panels monoscreen negative.  May be an atypical pneumonia given his duration of symptoms.  Will discharge with some azithromycin .  Tessalon  Perles.  Open follow-up with his  PCP.   Additional history obtained:  -External records from outside source obtained and reviewed including: Chart review including previous notes, labs, imaging, consultation notes   Lab Tests: -I ordered, reviewed, and interpreted labs.   The pertinent results include:   Labs Reviewed  COMPREHENSIVE METABOLIC PANEL WITH GFR - Abnormal; Notable for the following components:      Result Value   Potassium 3.4 (*)    Glucose, Bld 121 (*)    Calcium 8.8 (*)    All other components within normal limits  CBC WITH DIFFERENTIAL/PLATELET - Abnormal; Notable for the following components:   WBC 12.3 (*)    RBC 4.09 (*)    Hemoglobin 12.6 (*)    HCT 37.8 (*)    Neutro Abs 8.2 (*)    All other components within normal limits  URINALYSIS, W/ REFLEX TO CULTURE (INFECTION SUSPECTED) - Abnormal; Notable for the following components:   Bacteria, UA RARE (*)    All other components within normal limits  RESP PANEL BY RT-PCR (RSV, FLU A&B, COVID)  RVPGX2  MONONUCLEOSIS SCREEN       Imaging Studies ordered: I ordered imaging studies including x-ray, CT abdomen pelvis I independently visualized and interpreted imaging. I agree with the radiologist interpretation   Medicines ordered and prescription drug management: Meds ordered this encounter  Medications   acetaminophen  (TYLENOL ) tablet 1,000 mg   ondansetron  (ZOFRAN ) injection 4 mg   lactated ringers  bolus 1,000 mL   iohexol  (OMNIPAQUE ) 300 MG/ML solution 80 mL   benzonatate  (TESSALON ) capsule 200 mg   azithromycin  (ZITHROMAX ) 250 MG tablet    Sig: Take 1 tablet (250 mg total) by mouth daily. Take first 2 tablets together, then 1 every day until finished.    Dispense:  6 tablet    Refill:  0   benzonatate  (TESSALON ) 100 MG capsule    Sig: Take 1 capsule (100 mg total) by mouth every 8 (eight) hours.    Dispense:  21 capsule    Refill:  0    -I have reviewed the patients home medicines and have made adjustments as  needed   Cardiac Monitoring: The patient was maintained on a cardiac monitor.  I personally viewed and interpreted the cardiac monitored which showed an underlying rhythm of: Normal sinus rhythm  Social Determinants of Health:  Factors impacting patients care include: Access to primary care   Reevaluation: After the interventions noted above, I reevaluated the patient and found that they have :improved  Co morbidities that complicate the patient evaluation  Past  Medical History:  Diagnosis Date   Asthma    Not currently on medications; pending establishment of primary care   Current smoker    1.5 to 2 pack/day      Dispostion: I considered admission for this patient, however with his reassuring workup he is appropriate for discharge.     Final Clinical Impression(s) / ED Diagnoses Final diagnoses:  Subacute cough     @PCDICTATION @     [1]  Social History Tobacco Use   Smoking status: Former    Types: Cigarettes   Smokeless tobacco: Never  Vaping Use   Vaping status: Every Day  Substance Use Topics   Alcohol use: Yes    Comment: occ   Drug use: Never     Mannie Pac T, DO 01/06/24 2117  "

## 2024-01-06 NOTE — Discharge Instructions (Signed)
 Your blood work CT scan and chest x-ray today were negative.  I have sent your prescription for an antibiotic called azithromycin  which can help with this is a developing pneumonia.  Have also sent your cough medicine called Tessalon .  Please follow-up with your primary care doctor.

## 2024-01-06 NOTE — ED Triage Notes (Signed)
 Pt reports flu like S/S x3 weeks. Pt reports children recent tested positive for COVID.

## 2024-01-06 NOTE — ED Notes (Signed)
